# Patient Record
Sex: Male | Born: 1940 | Race: Asian | Hispanic: No | Marital: Married | Smoking: Former smoker
Health system: Southern US, Community
[De-identification: ages and names within clinical notes are randomized; demographics above are authoritative.]

## PROBLEM LIST (undated history)

## (undated) DIAGNOSIS — I249 Acute ischemic heart disease, unspecified: Secondary | ICD-10-CM

## (undated) DIAGNOSIS — I2510 Atherosclerotic heart disease of native coronary artery without angina pectoris: Secondary | ICD-10-CM

## (undated) HISTORY — DX: Atherosclerotic heart disease of native coronary artery without angina pectoris: I25.10

## (undated) HISTORY — DX: Acute ischemic heart disease, unspecified: I24.9

---

## 2015-09-02 HISTORY — PX: CORONARY ANGIOPLASTY WITH STENT PLACEMENT: SHX49

## 2021-08-15 ENCOUNTER — Encounter: Payer: Self-pay | Admitting: Nurse Practitioner

## 2021-08-15 ENCOUNTER — Ambulatory Visit (INDEPENDENT_AMBULATORY_CARE_PROVIDER_SITE_OTHER): Payer: Medicaid Other | Admitting: Nurse Practitioner

## 2021-08-15 ENCOUNTER — Other Ambulatory Visit: Payer: Self-pay

## 2021-08-15 VITALS — BP 133/69 | HR 69 | Temp 97.9°F | Ht 59.2 in | Wt 134.0 lb

## 2021-08-15 DIAGNOSIS — R300 Dysuria: Secondary | ICD-10-CM

## 2021-08-15 DIAGNOSIS — I2510 Atherosclerotic heart disease of native coronary artery without angina pectoris: Secondary | ICD-10-CM | POA: Diagnosis not present

## 2021-08-15 DIAGNOSIS — Z7689 Persons encountering health services in other specified circumstances: Secondary | ICD-10-CM | POA: Diagnosis not present

## 2021-08-15 DIAGNOSIS — R262 Difficulty in walking, not elsewhere classified: Secondary | ICD-10-CM

## 2021-08-15 DIAGNOSIS — Z125 Encounter for screening for malignant neoplasm of prostate: Secondary | ICD-10-CM

## 2021-08-15 DIAGNOSIS — Z1329 Encounter for screening for other suspected endocrine disorder: Secondary | ICD-10-CM

## 2021-08-15 DIAGNOSIS — Z789 Other specified health status: Secondary | ICD-10-CM

## 2021-08-15 MED ORDER — DILTIAZEM HCL ER BEADS 120 MG PO CP24: 120.0000 mg | ORAL_CAPSULE | Freq: Every day | ORAL | 1 refills | Status: DC

## 2021-08-15 MED ORDER — ROLLATOR ULTRA-LIGHT MISC: 1.0000 "application " | Freq: Every day | 0 refills | Status: AC

## 2021-08-15 MED ORDER — ROSUVASTATIN CALCIUM 20 MG PO TABS: 20.0000 mg | ORAL_TABLET | Freq: Every day | ORAL | 1 refills | Status: DC

## 2021-08-15 MED ORDER — CLOPIDOGREL BISULFATE 75 MG PO TABS: 75.0000 mg | ORAL_TABLET | Freq: Every day | ORAL | 1 refills | Status: DC

## 2021-08-15 NOTE — Patient Instructions (Signed)
Health Maintenance, Male °Adopting a healthy lifestyle and getting preventive care are important in promoting health and wellness. Ask your health care provider about: °The right schedule for you to have regular tests and exams. °Things you can do on your own to prevent diseases and keep yourself healthy. °What should I know about diet, weight, and exercise? °Eat a healthy diet ° °Eat a diet that includes plenty of vegetables, fruits, low-fat dairy products, and lean protein. °Do not eat a lot of foods that are high in solid fats, added sugars, or sodium. °Maintain a healthy weight °Body mass index (BMI) is a measurement that can be used to identify possible weight problems. It estimates body fat based on height and weight. Your health care provider can help determine your BMI and help you achieve or maintain a healthy weight. °Get regular exercise °Get regular exercise. This is one of the most important things you can do for your health. Most adults should: °Exercise for at least 150 minutes each week. The exercise should increase your heart rate and make you sweat (moderate-intensity exercise). °Do strengthening exercises at least twice a week. This is in addition to the moderate-intensity exercise. °Spend less time sitting. Even light physical activity can be beneficial. °Watch cholesterol and blood lipids °Have your blood tested for lipids and cholesterol at 81 years of age, then have this test every 5 years. °You may need to have your cholesterol levels checked more often if: °Your lipid or cholesterol levels are high. °You are older than 81 years of age. °You are at high risk for heart disease. °What should I know about cancer screening? °Many types of cancers can be detected early and may often be prevented. Depending on your health history and family history, you may need to have cancer screening at various ages. This may include screening for: °Colorectal cancer. °Prostate cancer. °Skin cancer. °Lung  cancer. °What should I know about heart disease, diabetes, and high blood pressure? °Blood pressure and heart disease °High blood pressure causes heart disease and increases the risk of stroke. This is more likely to develop in people who have high blood pressure readings or are overweight. °Talk with your health care provider about your target blood pressure readings. °Have your blood pressure checked: °Every 3-5 years if you are 18-39 years of age. °Every year if you are 40 years old or older. °If you are between the ages of 65 and 75 and are a current or former smoker, ask your health care provider if you should have a one-time screening for abdominal aortic aneurysm (AAA). °Diabetes °Have regular diabetes screenings. This checks your fasting blood sugar level. Have the screening done: °Once every three years after age 45 if you are at a normal weight and have a low risk for diabetes. °More often and at a younger age if you are overweight or have a high risk for diabetes. °What should I know about preventing infection? °Hepatitis B °If you have a higher risk for hepatitis B, you should be screened for this virus. Talk with your health care provider to find out if you are at risk for hepatitis B infection. °Hepatitis C °Blood testing is recommended for: °Everyone born from 1945 through 1965. °Anyone with known risk factors for hepatitis C. °Sexually transmitted infections (STIs) °You should be screened each year for STIs, including gonorrhea and chlamydia, if: °You are sexually active and are younger than 81 years of age. °You are older than 81 years of age and your   health care provider tells you that you are at risk for this type of infection. Your sexual activity has changed since you were last screened, and you are at increased risk for chlamydia or gonorrhea. Ask your health care provider if you are at risk. Ask your health care provider about whether you are at high risk for HIV. Your health care provider  may recommend a prescription medicine to help prevent HIV infection. If you choose to take medicine to prevent HIV, you should first get tested for HIV. You should then be tested every 3 months for as long as you are taking the medicine. Follow these instructions at home: Alcohol use Do not drink alcohol if your health care provider tells you not to drink. If you drink alcohol: Limit how much you have to 0-2 drinks a day. Know how much alcohol is in your drink. In the [place] one drink equals one 12 oz bottle of beer (355 mL), one 5 oz glass of wine (148 mL), or one 1 oz glass of hard liquor (44 mL). Lifestyle Do not use any products that contain nicotine or tobacco. These products include cigarettes, chewing tobacco, and vaping devices, such as e-cigarettes. If you need help quitting, ask your health care provider. Do not use street drugs. Do not share needles. Ask your health care provider for help if you need support or information about quitting drugs. General instructions Schedule regular health, dental, and eye exams. Stay current with your vaccines. Tell your health care provider if: You often feel depressed. You have ever been abused or do not feel safe at home. Summary Adopting a healthy lifestyle and getting preventive care are important in promoting health and wellness. Follow your health care provider's instructions about healthy diet, exercising, and getting tested or screened for diseases. Follow your health care provider's instructions on monitoring your cholesterol and blood pressure. This information is not intended to replace advice given to you by your health care provider. Make sure you discuss any questions you have with your health care provider. Document Revised: 01/07/2021 Document Reviewed: 01/07/2021 Elsevier Patient Education  2022 [employer]  Coronary Artery Disease, Male Coronary artery disease (CAD) is a condition in which the arteries that lead to the  heart (coronary arteries) become narrow or blocked. The narrowing or blockage can lead to decreased blood flow to the heart. Prolonged reduced blood flow can cause a heart attack (myocardial infarction or MI). This condition may also be called coronary heart disease. Because CAD is the leading cause of death in men, it is important to understand what causes this condition and how it is treated. What are the causes? CAD is most often caused by atherosclerosis. This is the buildup of fat and cholesterol (plaque) on the inside of the arteries. Over time, the plaque may narrow or block the artery, reducing blood flow to the heart. Plaque can also become weak and break off within a coronary artery and cause a sudden blockage. Other less common causes of CAD include: A blood clot or a piece of a blood clot or other substance that blocks the flow of blood in a coronary artery (embolism). A tearing of the artery (spontaneous coronary artery dissection). An enlargement of an artery (aneurysm). Inflammation (vasculitis) in the artery wall. What increases the risk? The following factors may make you more likely to develop this condition: Age. Men over age 81 are at a greater risk of CAD. Family history of CAD. Gender. Men often develop CAD earlier  in life than women. High blood pressure (hypertension). Diabetes. High cholesterol levels. Tobacco use. Excessive alcohol use. Lack of exercise. A diet high in saturated and trans fats, such as fried food and processed meat. Other possible risk factors include: High stress levels. Depression. Obesity. Sleep apnea. What are the signs or symptoms? Many people do not have any symptoms during the early stages of CAD. As the condition progresses, symptoms may include: Chest pain (angina). The pain can: Feel like crushing or squeezing, or like a tightness, pressure, fullness, or heaviness in the chest. Last more than a few minutes or can stop and recur. The  pain tends to get worse with exercise or stress and to fade with rest. Pain in the arms, neck, jaw, ear, or back. Unexplained heartburn or indigestion. Shortness of breath. Nausea or vomiting. Sudden light-headedness. Sudden cold sweats. Fluttering or fast heartbeat (palpitations). How is this diagnosed? This condition is diagnosed based on: Your family and medical history. A physical exam. Tests, including: A test to check the electrical signals in your heart (electrocardiogram). Exercise stress test. This looks for signs of blockage when the heart is stressed with exercise, such as running on a treadmill. Pharmacologic stress test. This test looks for signs of blockage when the heart is being stressed with a medicine. Blood tests. Coronary angiogram. This is a procedure to look at the coronary arteries to see if there is any blockage. During this test, a dye is injected into your arteries so they appear on an X-ray. Coronary artery CT scan. This CT scan helps detect calcium deposits in your coronary arteries. Calcium deposits are an indicator of CAD. A test that uses sound waves to take a picture of your heart (echocardiogram). Chest X-ray. How is this treated? This condition may be treated by: Healthy lifestyle changes to reduce risk factors. Medicines such as: Antiplatelet medicines and blood-thinning medicines, such as aspirin. These help to prevent blood clots. Nitroglycerin. Blood pressure medicines. Cholesterol-lowering medicine. Coronary angioplasty and stenting. During this procedure, a thin, flexible tube is inserted through a blood vessel and into a blocked artery. A balloon or similar device on the end of the tube is inflated to open up the artery. In some cases, a small, mesh tube (stent) is inserted into the artery to keep it open. Coronary artery bypass surgery. During this surgery, veins or arteries from other parts of the body are used to create a bypass around the  blockage and allow blood to reach your heart. Follow these instructions at home: Medicines Take over-the-counter and prescription medicines only as told by your health care provider. Do not take the following medicines unless your health care provider approves: NSAIDs, such as ibuprofen, naproxen, or celecoxib. Vitamin supplements that contain vitamin A, vitamin E, or both. Lifestyle Follow an exercise program approved by your health care provider. Aim for 150 minutes of moderate exercise or 75 minutes of vigorous exercise each week. Maintain a healthy weight or lose weight as approved by your health care provider. Learn to manage stress or try to limit your stress. Ask your health care provider for suggestions if you need help. Get screened for depression and seek treatment, if needed. Do not use any products that contain nicotine or tobacco, such as cigarettes, e-cigarettes, and chewing tobacco. If you need help quitting, ask your health care provider. Do not use illegal drugs. Eating and drinking  Follow a heart-healthy diet. A dietitian can help educate you about healthy food options and changes. In general,  eat plenty of fruits and vegetables, lean meats, and whole grains. Avoid foods high in: Sugar. Salt (sodium). Saturated fat, such as processed or fatty meat. Trans fat, such as fried foods. Use healthy cooking methods such as roasting, grilling, broiling, baking, poaching, steaming, or stir-frying. Do not drink alcohol if your health care provider tells you not to drink. If you drink alcohol: Limit how much you have to 0-2 drinks per day. Be aware of how much alcohol is in your drink. In the [place] one drink equals one 12 oz bottle of beer (355 mL), one 5 oz glass of wine (148 mL), or one 1 oz glass of hard liquor (44 mL). General instructions Manage any other health conditions, such as hypertension and diabetes. These conditions affect your heart. Your health care provider may  ask you to monitor your blood pressure. Ideally, your blood pressure should be below 130/80. Keep all follow-up visits as told by your health care provider. This is important. Get help right away if: You have pain in your chest, neck, ear, arm, jaw, stomach, or back that: Lasts more than a few minutes. Is recurring. Is not relieved by taking medicine under your tongue (sublingual nitroglycerin). You have profuse sweating without cause. You have unexplained: Heartburn or indigestion. Shortness of breath or difficulty breathing. Fluttering or fast heartbeat (palpitations). Nausea or vomiting. Fatigue. Feelings of nervousness or anxiety. Weakness. Diarrhea. You have sudden light-headedness or dizziness. You faint. You feel like hurting yourself or think about taking your own life. These symptoms may represent a serious problem that is an emergency. Do not wait to see if the symptoms will go away. Get medical help right away. Call your local emergency services (911 in the [place] Do not drive yourself to the hospital. Summary Coronary artery disease (CAD) is a condition in which the arteries that lead to the heart (coronary arteries) become narrow or blocked. The narrowing or blockage can lead to a heart attack. Many people do not have any symptoms during the early stages of CAD. CAD can be treated with lifestyle changes, medicines, surgery, or a combination of these treatments. This information is not intended to replace advice given to you by your health care provider. Make sure you discuss any questions you have with your health care provider. Document Revised: 05/07/2018 Document Reviewed: 04/27/2018 Elsevier Patient Education  2022 [employer].

## 2021-08-15 NOTE — Progress Notes (Signed)
[facility] Patient [facility] [address] [place], [place]  [address] Phone:  [phone]   Fax:  [phone]   New Patient Office Visit  Subjective:  Patient ID: Christopher Chandler, male    DOB: August 07, 1941  Age: 80 y.o. MRN: [id number]  CC:  Chief Complaint  Patient presents with   Establish Care    Recently arrived [name] 1 week ago, per medical from patient is needing Cardiology consultation.  Complaining of burning while urinating.     HPI Christopher Chandler presents for establish care.  He  has a past medical history of ACS (acute coronary syndrome) (HCC) and CAD (coronary artery disease).   He recently moved from [name]. He has CAD. He has a stent placed. He is curerntly on Plavix, diltiazem and  rosuvastatin . He denies any chest pain. He has some shortness of breath with walking. Denies headache, dizziness, visual changes, chest pain, nausea, vomiting or any edema. He has come concerns about his prostate. He suffers from dysuria.   He is SP lumbar spinal fusion. He is no longer a candidate for surgery.   He suffers from knee pain. He has problems walking due to the pain in lower back. He is unable to walk for long periods.   Past Medical History:  Diagnosis Date   ACS (acute coronary syndrome) (HCC)    CAD (coronary artery disease)     Past Surgical History:  Procedure Laterality Date   CORONARY ANGIOPLASTY WITH STENT PLACEMENT  2017    History reviewed. No pertinent family history.  Social History   Socioeconomic History   Marital status: Married    Spouse name: Not on file   Number of children: Not on file   Years of education: Not on file   Highest education level: Not on file  Occupational History   Not on file  Tobacco Use   Smoking status: Former    Types: Cigarettes   Smokeless tobacco: Former  Substance and Sexual Activity   Alcohol use: Never   Drug use: Never   Sexual activity: Not on file  Other Topics Concern   Not on file  Social  History Narrative   Not on file   Social Determinants of Health   Financial Resource Strain: Not on file  Food Insecurity: Not on file  Transportation Needs: Not on file  Physical Activity: Not on file  Stress: Not on file  Social Connections: Not on file  Intimate Partner Violence: Not on file    Outpatient Medications Prior to Visit  Medication Sig Dispense Refill   b complex vitamins capsule Take 1 capsule by mouth daily.     clopidogrel (PLAVIX) 75 MG tablet Take 75 mg by mouth daily. Take 1 tablet daily in the evening.     diltiazem (TIAZAC) 120 MG 24 hr capsule Take 120 mg by mouth daily. Take 1 daily in the morning.     rosuvastatin (CRESTOR) 20 MG tablet Take 20 mg by mouth daily. Take one tablet daily in the evening.     No facility-administered medications prior to visit.    No Known Allergies  ROS Review of Systems  HENT:         Hearing aide both ears     Objective:    Physical Exam Constitutional:      General: He is not in acute distress.    Appearance: He is normal weight. He is not ill-appearing, toxic-appearing or diaphoretic.  HENT:  Head: Normocephalic and atraumatic.     Ears:     Comments: Bilateral hearing aids    Nose: Nose normal.     Mouth/Throat:     Mouth: Mucous membranes are moist.  Cardiovascular:     Rate and Rhythm: Normal rate and regular rhythm.     Pulses: Normal pulses.     Heart sounds: Normal heart sounds.  Pulmonary:     Effort: Pulmonary effort is normal.     Breath sounds: Normal breath sounds.  Abdominal:     Palpations: Abdomen is soft.     Comments: Hypoactive  Musculoskeletal:     Cervical back: Normal range of motion. No rigidity.     Right lower leg: Edema (trace) present.     Left lower leg: Edema (trace) present.  Skin:    General: Skin is warm and dry.     Capillary Refill: Capillary refill takes less than 2 seconds.  Neurological:     General: No focal deficit present.     Mental Status: He is alert  and oriented to person, place, and time.  Psychiatric:        Mood and Affect: Mood normal.        Behavior: Behavior normal.        Thought Content: Thought content normal.        Judgment: Judgment normal.   BP 133/69    Pulse 69    Temp 97.9 F (36.6 C)    Ht 4' 11.2" (1.504 m)    Wt 134 lb 0.2 oz (60.8 kg)    SpO2 98%    BMI 26.88 kg/m  Wt Readings from Last 3 Encounters:  08/15/21 134 lb 0.2 oz (60.8 kg)     There are no preventive care reminders to display for this patient.   There are no preventive care reminders to display for this patient.    Assessment & Plan:   Problem List Items Addressed This Visit   None Visit Diagnoses     Encounter to establish care    -  Primary .Discussed male health maintenance;   and annual exams. Discussed prostate age related concerns Discussed general safety in vehicle and COVID Discussed regular hydration with water Discussed healthy diet and exercise and weight management Discussed mental health Encouraged to call our office for an appointment with in ongoing concerns for questions.       Language barrier to communication       Coronary artery disease involving native heart without angina pectoris, unspecified vessel or lesion type     Audiology referral for evaluation   Relevant Medications   diltiazem (TIAZAC) 120 MG 24 hr capsule   rosuvastatin (CRESTOR) 20 MG tablet   Other Relevant Orders   Comp. Metabolic Panel (12) (Completed)   Lipid panel (Completed)   Microalbumin, urine   Dysuria       Relevant Orders   CBC with Differential/Platelet (Completed)   Urine Culture   Microalbumin, urine   Screening PSA (prostate specific antigen)       Relevant Orders   PSA (Completed)   Screening for thyroid disorder       Relevant Orders   TSH (Completed)   Ambulatory dysfunction       Relevant Orders   For home use only DME 4 wheeled rolling walker with seat ([id number])       Meds ordered this encounter  Medications    Misc. Devices (ROLLATOR ULTRA-LIGHT) MISC    Sig: 1  application by Does not apply route daily.    Dispense:  1 each    Refill:  0    Order Specific Question:   Supervising Provider    Answer:   Quentin Angst [[id number]]   diltiazem (TIAZAC) 120 MG 24 hr capsule    Sig: Take 1 capsule (120 mg total) by mouth daily. Take 1 daily in the morning.    Dispense:  90 capsule    Refill:  1    Order Specific Question:   Supervising Provider    Answer:   Quentin Angst [id number]   clopidogrel (PLAVIX) 75 MG tablet    Sig: Take 1 tablet (75 mg total) by mouth daily. Take 1 tablet daily in the evening.    Dispense:  90 tablet    Refill:  1    Order Specific Question:   Supervising Provider    Answer:   Quentin Angst [[id number]]   rosuvastatin (CRESTOR) 20 MG tablet    Sig: Take 1 tablet (20 mg total) by mouth daily. Take one tablet daily in the evening.    Dispense:  90 tablet    Refill:  1    Order Specific Question:   Supervising Provider    Answer:   Quentin Angst [id number]    Follow-up: No follow-ups on file.    Barbette Merino, NP

## 2021-08-16 ENCOUNTER — Encounter: Payer: Self-pay | Admitting: Nurse Practitioner

## 2021-08-16 LAB — LIPID PANEL
Chol/HDL Ratio: 2.8 ratio (ref 0.0–5.0)
Cholesterol, Total: 115 mg/dL (ref 100–199)
HDL: 41 mg/dL (ref >39)
LDL Chol Calc (NIH): 57 mg/dL (ref 0–99)
Triglycerides: 86 mg/dL (ref 0–149)
VLDL Cholesterol Cal: 17 mg/dL (ref 5–40)

## 2021-08-16 LAB — COMP. METABOLIC PANEL (12)
AST: 35 IU/L (ref 0–40)
Albumin/Globulin Ratio: 1.5 (ref 1.2–2.2)
Albumin: 4 g/dL (ref 3.6–4.6)
Alkaline Phosphatase: 98 IU/L (ref 44–121)
BUN/Creatinine Ratio: 18 (ref 10–24)
BUN: 17 mg/dL (ref 8–27)
Bilirubin Total: 0.4 mg/dL (ref 0.0–1.2)
Calcium: 9.3 mg/dL (ref 8.6–10.2)
Chloride: 105 mmol/L (ref 96–106)
Creatinine, Ser: 0.94 mg/dL (ref 0.76–1.27)
Globulin, Total: 2.6 g/dL (ref 1.5–4.5)
Glucose: 98 mg/dL (ref 70–99)
Potassium: 4.2 mmol/L (ref 3.5–5.2)
Sodium: 142 mmol/L (ref 134–144)
Total Protein: 6.6 g/dL (ref 6.0–8.5)
eGFR: 81 mL/min/{1.73_m2} (ref >59)

## 2021-08-16 LAB — CBC WITH DIFFERENTIAL/PLATELET
Basophils Absolute: 0 10*3/uL (ref 0.0–0.2)
Basos: 1 %
EOS (ABSOLUTE): 0.2 10*3/uL (ref 0.0–0.4)
Eos: 3 %
Hematocrit: 39.9 % (ref 37.5–51.0)
Hemoglobin: 13 g/dL (ref 13.0–17.7)
Immature Grans (Abs): 0 10*3/uL (ref 0.0–0.1)
Immature Granulocytes: 0 %
Lymphocytes Absolute: 2.3 10*3/uL (ref 0.7–3.1)
Lymphs: 37 %
MCH: 31.4 pg (ref 26.6–33.0)
MCHC: 32.6 g/dL (ref 31.5–35.7)
MCV: 96 fL (ref 79–97)
Monocytes Absolute: 0.5 10*3/uL (ref 0.1–0.9)
Monocytes: 9 %
Neutrophils Absolute: 3.2 10*3/uL (ref 1.4–7.0)
Neutrophils: 50 %
Platelets: 184 10*3/uL (ref 150–450)
RBC: 4.14 x10E6/uL (ref 4.14–5.80)
RDW: 12.5 % (ref 11.6–15.4)
WBC: 6.3 10*3/uL (ref 3.4–10.8)

## 2021-08-16 LAB — PSA: Prostate Specific Ag, Serum: 1.2 ng/mL (ref 0.0–4.0)

## 2021-08-16 LAB — TSH: TSH: 1.81 u[IU]/mL (ref 0.450–4.500)

## 2021-08-21 ENCOUNTER — Other Ambulatory Visit: Payer: Self-pay

## 2021-08-21 MED ORDER — ROSUVASTATIN CALCIUM 20 MG PO TABS
20.0000 mg | ORAL_TABLET | Freq: Every day | ORAL | 1 refills | Status: DC
  Filled 2021-08-21: qty 90, 90d supply, fill #0

## 2021-08-21 NOTE — Telephone Encounter (Signed)
Patient case worker stated Rosuvastatin was $200 at [facility]. Rx sent to [facility]. Left voicemail with the case worker Alsana advising of the change.

## 2021-08-22 ENCOUNTER — Other Ambulatory Visit: Payer: Self-pay | Admitting: Nurse Practitioner

## 2021-08-22 LAB — URINE CULTURE

## 2021-08-22 LAB — MICROALBUMIN, URINE: Microalbumin, Urine: 17.3 ug/mL

## 2021-08-22 MED ORDER — NITROFURANTOIN MONOHYD MACRO 100 MG PO CAPS: 100.0000 mg | ORAL_CAPSULE | Freq: Two times a day (BID) | ORAL | 0 refills | Status: AC

## 2021-08-28 ENCOUNTER — Other Ambulatory Visit: Payer: Self-pay

## 2021-09-24 ENCOUNTER — Ambulatory Visit
Admission: RE | Admit: 2021-09-24 | Discharge: 2021-09-24 | Disposition: A | Discharge status: Home / Self Care | Payer: Self-pay | Source: Ambulatory Visit | Attending: Obstetrics and Gynecology | Admitting: Obstetrics and Gynecology

## 2021-09-24 ENCOUNTER — Other Ambulatory Visit: Payer: Self-pay

## 2021-09-24 ENCOUNTER — Other Ambulatory Visit: Payer: Self-pay | Admitting: Obstetrics and Gynecology

## 2021-09-24 DIAGNOSIS — R9389 Abnormal findings on diagnostic imaging of other specified body structures: Secondary | ICD-10-CM

## 2021-09-27 ENCOUNTER — Telehealth: Payer: Self-pay | Admitting: Nurse Practitioner

## 2021-09-27 NOTE — Telephone Encounter (Signed)
Patient's case manager is requesting update on the referral to cardiology. She also states patient is running low on medication (I have requested the name of the med from the case manager).  I do not see a referral, but could be missing something.  Case Manager: Herschell Dimes  [phone]

## 2021-10-07 ENCOUNTER — Telehealth: Payer: Self-pay | Admitting: Nurse Practitioner

## 2021-10-07 ENCOUNTER — Other Ambulatory Visit (HOSPITAL_COMMUNITY): Payer: Self-pay

## 2021-10-07 ENCOUNTER — Other Ambulatory Visit: Payer: Self-pay | Admitting: Nurse Practitioner

## 2021-10-07 MED ORDER — ROSUVASTATIN CALCIUM 20 MG PO TABS
20.0000 mg | ORAL_TABLET | Freq: Every day | ORAL | 1 refills | Status: AC
  Filled 2021-10-07: qty 90, 90d supply, fill #0

## 2021-10-07 MED ORDER — DILTIAZEM HCL ER BEADS 120 MG PO CP24
120.0000 mg | ORAL_CAPSULE | Freq: Every day | ORAL | 1 refills | Status: AC
Start: 2021-10-07 — End: 2022-04-05
  Filled 2021-10-07: qty 90, 90d supply, fill #0

## 2021-10-07 MED ORDER — CLOPIDOGREL BISULFATE 75 MG PO TABS
75.0000 mg | ORAL_TABLET | Freq: Every day | ORAL | 1 refills | Status: AC
Start: 2021-10-07 — End: 2022-04-05
  Filled 2021-10-07: qty 90, 90d supply, fill #0

## 2021-10-07 NOTE — Telephone Encounter (Signed)
Patient's case manager indicates patient is running low on meds:  Diltiazem 90mg  SR Diltiazem 30 MG Rosuvastatin 20mg  Glocovit dietary supplement   Case Manager: [phone]

## 2021-10-07 NOTE — Telephone Encounter (Signed)
Addendum - Send meds to [facility] and let case manager know when they are sent. Herschell Dimes  [phone]

## 2021-10-08 ENCOUNTER — Other Ambulatory Visit (HOSPITAL_COMMUNITY): Payer: Self-pay

## 2021-10-09 ENCOUNTER — Other Ambulatory Visit (HOSPITAL_COMMUNITY): Payer: Self-pay

## 2021-10-10 ENCOUNTER — Other Ambulatory Visit (HOSPITAL_COMMUNITY): Payer: Self-pay

## 2021-11-04 ENCOUNTER — Other Ambulatory Visit: Payer: Self-pay | Admitting: Obstetrics and Gynecology

## 2021-11-04 ENCOUNTER — Ambulatory Visit
Admission: RE | Admit: 2021-11-04 | Discharge: 2021-11-04 | Disposition: A | Discharge status: Home / Self Care | Payer: No Typology Code available for payment source | Source: Ambulatory Visit | Attending: Obstetrics and Gynecology | Admitting: Obstetrics and Gynecology

## 2021-11-04 DIAGNOSIS — R9389 Abnormal findings on diagnostic imaging of other specified body structures: Secondary | ICD-10-CM

## 2021-11-08 ENCOUNTER — Telehealth: Payer: Self-pay | Admitting: Nurse Practitioner

## 2021-11-08 NOTE — Telephone Encounter (Signed)
She has requested the pt have a CAT scan done of chest, but chest Xray was done instead. Request was faxed on 3/2. She left voicemail for nurse a couple days ago.  ?

## 2021-11-11 ENCOUNTER — Telehealth: Payer: Self-pay | Admitting: Nurse Practitioner

## 2021-11-11 NOTE — Telephone Encounter (Signed)
Patient's case worker - Herschell Dimes ?Phone [phone] asked about patient's vitamin: Becozym C Forte ( Vitamin B Complex + Biotin + Vitamin C vitamins) that patient is unable to find over the counter. Is there an RX for this supplement or can his provider direct him to a pharmacy that carries the supplement? ? ?

## 2021-11-12 NOTE — Telephone Encounter (Signed)
Advise patient he can purchase vitamin B complex over the counter at any pharmacy no prescription is needed  ?

## 2021-11-13 ENCOUNTER — Ambulatory Visit (HOSPITAL_COMMUNITY)
Admission: RE | Admit: 2021-11-13 | Discharge: 2021-11-13 | Disposition: A | Discharge status: Home / Self Care | Payer: Medicaid Other | Source: Ambulatory Visit | Attending: Nurse Practitioner | Admitting: Nurse Practitioner

## 2021-11-13 ENCOUNTER — Ambulatory Visit (INDEPENDENT_AMBULATORY_CARE_PROVIDER_SITE_OTHER): Payer: Medicaid Other | Admitting: Nurse Practitioner

## 2021-11-13 ENCOUNTER — Other Ambulatory Visit: Payer: Self-pay

## 2021-11-13 ENCOUNTER — Encounter: Payer: Self-pay | Admitting: Nurse Practitioner

## 2021-11-13 VITALS — BP 134/53 | HR 64 | Temp 97.6°F | Ht 59.2 in | Wt 130.6 lb

## 2021-11-13 DIAGNOSIS — G8929 Other chronic pain: Secondary | ICD-10-CM | POA: Diagnosis present

## 2021-11-13 DIAGNOSIS — Z789 Other specified health status: Secondary | ICD-10-CM | POA: Diagnosis not present

## 2021-11-13 DIAGNOSIS — M160 Bilateral primary osteoarthritis of hip: Secondary | ICD-10-CM

## 2021-11-13 DIAGNOSIS — M5441 Lumbago with sciatica, right side: Secondary | ICD-10-CM | POA: Diagnosis not present

## 2021-11-13 DIAGNOSIS — K5900 Constipation, unspecified: Secondary | ICD-10-CM | POA: Diagnosis not present

## 2021-11-13 DIAGNOSIS — R262 Difficulty in walking, not elsewhere classified: Secondary | ICD-10-CM

## 2021-11-13 MED ORDER — B COMPLEX VITAMINS PO CAPS: 1.0000 | ORAL_CAPSULE | Freq: Every day | ORAL | 3 refills | Status: AC

## 2021-11-13 MED ORDER — POLYETHYLENE GLYCOL 3350 17 GM/SCOOP PO POWD: 17.0000 g | ORAL | 0 refills | Status: AC

## 2021-11-13 MED ORDER — VITAMIN B-12 1000 MCG PO TABS: 1000.0000 ug | ORAL_TABLET | Freq: Every day | ORAL | 2 refills | Status: AC

## 2021-11-13 MED ORDER — ROLLATOR ULTRA-LIGHT MISC: 1.0000 | Freq: Every day | 0 refills | Status: AC

## 2021-11-13 NOTE — Patient Instructions (Addendum)
Christopher Chandler  ?First Floor Radiology Department  ? ?Imaging Scheduling if you get lost please call  ?[phone] ? ? ?Christopher Chandler ?This is your after visit summary for today.  ?1. Language barrier to communication ? ?2. Ambulatory dysfunction ?- DME Wheelchair electric ? ?3. Constipation, unspecified constipation type ? ?4. Chronic bilateral low back pain with right-sided sciatica ? ?Christopher Chandler ?- DG Lumbar Spine Complete; Future ?- DG Foot Complete Right; Future ? ?5. Primary osteoarthritis of both hips ?- DG HIPS BILAT WITH PELVIS 2V; Future ?  ?No follow-ups on file.  ?Please feel free to call our office, if you have any questions. ?Have a good day. ?Christopher Chandler ? ? ? ? ? ? ?Chronic Back Pain ?When back pain lasts longer than 3 months, it is called chronic back pain. Pain may get worse at certain times (flare-ups). There are things you can do at home to manage your pain. ?Follow these instructions at home: ?Pay attention to any changes in your symptoms. Take these actions to help with your pain: ?Managing pain and stiffness ?  ?If told, put ice on the painful area. Your doctor may tell you to use ice for 24-48 hours after the flare-up starts. To do this: ?Put ice in a plastic bag. ?Place a towel between your skin and the bag. ?Leave the ice on for 20 minutes, 2-3 times a day. ?If told, put heat on the painful area. Do this as often as told by your doctor. Use the heat source that your doctor recommends, such as a moist heat pack or a heating pad. ?Place a towel between your skin and the heat source. ?Leave the heat on for 20-30 minutes. ?Take off the heat if your skin turns bright red. This is especially important if you are unable to feel pain, heat, or cold. You may have a greater risk of getting burned. ?Soak in a warm bath. This can help relieve pain. ?Activity ? ?Avoid bending and other activities that make pain worse. ?When standing: ?Keep your upper back and neck straight. ?Keep your shoulders  pulled back. ?Avoid slouching. ?When sitting: ?Keep your back straight. ?Relax your shoulders. Do not round your shoulders or pull them backward. ?Do not sit or stand in one place for Chandler periods of time. ?Take short rest breaks during the day. Lying down or standing is usually better than sitting. Resting can help relieve pain. ?When sitting or lying down for a Chandler time, do some mild activity or stretching. This will help to prevent stiffness and pain. ?Get regular exercise. Ask your doctor what activities are safe for you. ?Do not lift anything that is heavier than 10 lb (4.5 kg) or the limit that you are told, until your doctor says that it is safe. ?To prevent injury when you lift things: ?Bend your knees. ?Keep the weight close to your body. ?Avoid twisting. ?Sleep on a firm mattress. Try lying on your side with your knees slightly bent. If you lie on your back, put a pillow under your knees. ?Medicines ?Treatment may include medicines for pain and swelling taken by mouth or put on the skin, prescription pain medicine, or muscle relaxants. ?Take over-the-counter and prescription medicines only as told by your doctor. ?Ask your doctor if the medicine prescribed to you: ?Requires you to avoid driving or using machinery. ?Can cause trouble pooping (constipation). You may need to take these actions to prevent or treat trouble pooping: ?Drink enough fluid to keep your pee (urine)  pale yellow. ?Take over-the-counter or prescription medicines. ?Eat foods that are high in fiber. These include beans, whole grains, and fresh fruits and vegetables. ?Limit foods that are high in fat and sugars. These include fried or sweet foods. ?General instructions ?Do not use any products that contain nicotine or tobacco, such as cigarettes, e-cigarettes, and chewing tobacco. If you need help quitting, ask your doctor. ?Keep all follow-up visits as told by your doctor. This is important. ?Contact a doctor if: ?Your pain does not get  better with rest or medicine. ?Your pain gets worse, or you have new pain. ?You have a high fever. ?You lose weight very quickly. ?You have trouble doing your normal activities. ?Get help right away if: ?One or both of your legs or feet feel weak. ?One or both of your legs or feet lose feeling (have numbness). ?You have trouble controlling when you poop (have a bowel movement) or pee (urinate). ?You have bad back pain and: ?You feel like you may vomit (nauseous), or you vomit. ?You have pain in your belly (abdomen). ?You have shortness of breath. ?You faint. ?Summary ?When back pain lasts longer than 3 months, it is called chronic back pain. ?Pain may get worse at certain times (flare-ups). ?Use ice and heat as told by your doctor. Your doctor may tell you to use ice after flare-ups. ?This information is not intended to replace advice given to you by your health care provider. Make sure you discuss any questions you have with your health care provider. ?Constipation, Adult ?Constipation is when a person has trouble pooping (having a bowel movement). When you have this condition, you may poop fewer than 3 times a week. Your poop (stool) may also be dry, hard, or bigger than normal. ?Follow these instructions at home: ?Eating and drinking ? ?Eat foods that have a lot of fiber, such as: ?Fresh fruits and vegetables. ?Whole grains. ?Beans. ?Eat less of foods that are low in fiber and high in fat and sugar, such as: ?[place] fries. ?Hamburgers. ?Cookies. ?Candy. ?Soda. ?Drink enough fluid to keep your pee (urine) pale yellow. ?General instructions ?Exercise regularly or as told by your doctor. Try to do 150 minutes of exercise each week. ?Go to the restroom when you feel like you need to poop. Do not hold it in. ?Take over-the-counter and prescription medicines only as told by your doctor. These include any fiber supplements. ?When you poop: ?Do deep breathing while relaxing your lower belly (abdomen). ?Relax your pelvic  floor. The pelvic floor is a group of muscles that support the rectum, bladder, and intestines (as well as the uterus in women). ?Watch your condition for any changes. Tell your doctor if you notice any. ?Keep all follow-up visits as told by your doctor. This is important. ?Contact a doctor if: ?You have pain that gets worse. ?You have a fever. ?You have not pooped for 4 days. ?You vomit. ?You are not hungry. ?You lose weight. ?You are bleeding from the opening of the butt (anus). ?You have thin, pencil-like poop. ?Get help right away if: ?You have a fever, and your symptoms suddenly get worse. ?You leak poop or have blood in your poop. ?Your belly feels hard or bigger than normal (bloated). ?You have very bad belly pain. ?You feel dizzy or you faint. ?Summary ?Constipation is when a person poops fewer than 3 times a week, has trouble pooping, or has poop that is dry, hard, or bigger than normal. ?Eat foods that have a lot of  fiber. ?Drink enough fluid to keep your pee (urine) pale yellow. ?Take over-the-counter and prescription medicines only as told by your doctor. These include any fiber supplements. ?This information is not intended to replace advice given to you by your health care provider. Make sure you discuss any questions you have with your health care provider. ?Document Revised: 07/06/2019 Document Reviewed: 07/06/2019 ?Elsevier Patient Education ? 2022 Elsevier Inc. ? ? ?Document Revised: 09/28/2019 Document Reviewed: 09/28/2019 ?Elsevier Patient Education ? 2022 Elsevier Inc. ? ?

## 2021-11-13 NOTE — Progress Notes (Signed)
? ?[facility] ?[address] ?[place], [place]  [address] ?Phone:  [phone]   Fax:  [phone] ? ? ?Established Patient Office Visit ? ?Subjective:  ?Patient ID: Christopher Chandler, male    DOB: 10/10/1940  Age: 80 y.o. MRN: [id number] ? ?CC:  ?Chief Complaint  ?Patient presents with  ? Follow-up  ?  Pt is here for 3 month follow up. Pt is having problem with walking and shortness of breath. Pt is interested in a walker and electronic wheelchair. Pt son would like a Rx for vitamin B  ? ? ?HPI ?Christopher Chandler presents for follow up. He  has a past medical history of ACS (acute coronary syndrome) (HCC) and CAD (coronary artery disease).  ?He is in today for follow-up.  He continues to experience difficulty walking.  A rollator was prescribed at last visit however this has not been obtained.  He does currently have a wheelchair.  The rollator is needed during the day for ambulation when family is not available.  He is also experiencing low back pain.He denies any recent injury or falls. He is SP low back surgery 17 yrs ago. He is also having right foot pain which is increased with walking. ? ?He is wanting a Rx for Vitamin B.  ? ?Upper Respiratory Infection ?Patient complains of symptoms of a URI. Symptoms include productive cough with  clear colored sputum. Onset of symptoms was a few days ago, and has been unchanged since that time. Treatment to date: none. His son does not want him to have any additional medication for the cough. ? ? ?Past Medical History:  ?Diagnosis Date  ? ACS (acute coronary syndrome) (HCC)   ? CAD (coronary artery disease)   ? ? ?Past Surgical History:  ?Procedure Laterality Date  ? CORONARY ANGIOPLASTY WITH STENT PLACEMENT  2017  ? ? ?History reviewed. No pertinent family history. ? ?Social History  ? ?Socioeconomic History  ? Marital status: Married  ?  Spouse name: Not on file  ? Number of children: Not on file  ? Years of education: Not on file  ? Highest education level:  Not on file  ?Occupational History  ? Not on file  ?Tobacco Use  ? Smoking status: Former  ?  Types: Cigarettes  ? Smokeless tobacco: Former  ?Substance and Sexual Activity  ? Alcohol use: Never  ? Drug use: Never  ? Sexual activity: Not on file  ?Other Topics Concern  ? Not on file  ?Social History Narrative  ? Not on file  ? ?Social Determinants of Health  ? ?Financial Resource Strain: Not on file  ?Food Insecurity: Not on file  ?Transportation Needs: Not on file  ?Physical Activity: Not on file  ?Stress: Not on file  ?Social Connections: Not on file  ?Intimate Partner Violence: Not on file  ? ? ?Outpatient Medications Prior to Visit  ?Medication Sig Dispense Refill  ? b complex vitamins capsule Take 1 capsule by mouth daily.    ? clopidogrel (PLAVIX) 75 MG tablet Take 1 tablet by mouth in the evening. 90 tablet 1  ? diltiazem (TIAZAC) 120 MG 24 hr capsule Take 1 capsule by mouth in the morning. 90 capsule 1  ? rosuvastatin (CRESTOR) 20 MG tablet Take 1 tablet by mouth daily in the evening. 90 tablet 1  ? Misc. Devices (ROLLATOR ULTRA-LIGHT) MISC 1 application by Does not apply route daily. 1 each 0  ? ?No facility-administered medications prior to visit.  ? ? ?  Allergies  ?Allergen Reactions  ? Metronidazole   ? ? ?ROS ?Review of Systems  ?Gastrointestinal:  Positive for constipation.  ? ?  ?Objective:  ?  ?Physical Exam ?HENT:  ?   Head: Normocephalic and atraumatic.  ?   Nose: Nose normal.  ?   Mouth/Throat:  ?   Mouth: Mucous membranes are moist.  ?Cardiovascular:  ?   Rate and Rhythm: Normal rate and regular rhythm.  ?   Pulses: Normal pulses.  ?   Heart sounds: Normal heart sounds.  ?Pulmonary:  ?   Effort: Pulmonary effort is normal.  ?   Breath sounds: Normal breath sounds.  ?Abdominal:  ?   General: Bowel sounds are normal.  ?   Palpations: Abdomen is soft.  ?Musculoskeletal:     ?   General: Normal range of motion.  ?   Cervical back: Normal range of motion.  ?Skin: ?   General: Skin is warm and dry.  ?    Capillary Refill: Capillary refill takes less than 2 seconds.  ?Neurological:  ?   General: No focal deficit present.  ?   Mental Status: He is alert and oriented to person, place, and time.  ?Psychiatric:     ?   Mood and Affect: Mood normal.     ?   Behavior: Behavior normal.     ?   Thought Content: Thought content normal.     ?   Judgment: Judgment normal.  ? ? ?BP (!) 134/53   Pulse 64   Temp 97.6 ?F (36.4 ?C)   Ht 4' 11.2" (1.504 m)   Wt 130 lb 9.6 oz (59.2 kg)   SpO2 100%   BMI 26.20 kg/m?  ?Wt Readings from Last 3 Encounters:  ?11/13/21 130 lb 9.6 oz (59.2 kg)  ?08/15/21 134 lb 0.2 oz (60.8 kg)  ? ? ? ?Health Maintenance Due  ?Topic Date Due  ? COVID-19 Vaccine (1) Never done  ? Zoster Vaccines- Shingrix (1 of 2) Never done  ? ? ?There are no preventive care reminders to display for this patient. ? ?Lab Results  ?Component Value Date  ? TSH 1.810 08/15/2021  ? ?Lab Results  ?Component Value Date  ? WBC 6.3 08/15/2021  ? HGB 13.0 08/15/2021  ? HCT 39.9 08/15/2021  ? MCV 96 08/15/2021  ? PLT 184 08/15/2021  ? ?Lab Results  ?Component Value Date  ? NA 142 08/15/2021  ? K 4.2 08/15/2021  ? GLUCOSE 98 08/15/2021  ? BUN 17 08/15/2021  ? CREATININE 0.94 08/15/2021  ? BILITOT 0.4 08/15/2021  ? ALKPHOS 98 08/15/2021  ? AST 35 08/15/2021  ? PROT 6.6 08/15/2021  ? ALBUMIN 4.0 08/15/2021  ? CALCIUM 9.3 08/15/2021  ? EGFR 81 08/15/2021  ? ?Lab Results  ?Component Value Date  ? CHOL 115 08/15/2021  ? ?Lab Results  ?Component Value Date  ? HDL 41 08/15/2021  ? ?Lab Results  ?Component Value Date  ? [place] 57 08/15/2021  ? ?Lab Results  ?Component Value Date  ? TRIG 86 08/15/2021  ? ?Lab Results  ?Component Value Date  ? CHOLHDL 2.8 08/15/2021  ? ?No results found for: HGBA1C ? ?  ?Assessment & Plan:  ? ?Problem List Items Addressed This Visit   ?None ?Visit Diagnoses   ? ? Ambulatory dysfunction    -  Primary  ? Relevant Orders  ? Rollator  ? Language barrier to communication      ? Constipation, unspecified  constipation type     ?  He ?Encouraged stool softeners one to two times per day based on what was discussed ?Encouraged Miralax QOD to daily based on need and dicussion ?Encouraged hydration with water at least 8-10 8 ounce glasses per day ?Eat foods that have a lot of fiber, such as: ?Fresh fruits and vegetables. ?Whole grains. ?Beans. ?Eat less of foods that are high in fat, low in fiber, or overly processed ?Encouraged regular daily exercise starting with walking 20 minutes per day ?  ? Chronic bilateral low back pain with right-sided sciatica     ?Persistent ?Encouraged evaluation with images to evaluate possible causes pain to discuss treatment options  ? Relevant Orders  ? DG Lumbar Spine Complete  ? DG Foot Complete Right  ? Primary osteoarthritis of both hips      ? Relevant Orders  ? DG HIPS BILAT WITH PELVIS 2V  ? ?  ? ? ?Meds ordered this encounter  ?Medications  ? vitamin B-12 (CYANOCOBALAMIN) 1000 MCG tablet  ?  Sig: Take 1 tablet (1,000 mcg total) by mouth daily.  ?  Dispense:  30 tablet  ?  Refill:  2  ?  Order Specific Question:   Supervising Provider  ?  AnswerTresa Garter [[id number]]  ? polyethylene glycol powder (GLYCOLAX/MIRALAX) 17 GM/SCOOP powder  ?  Sig: Take 17 g by mouth every other day. Follow instructions in the bottle. Use no more than 7 days. PRN for occasional constipation.  ?  Dispense:  500 g  ?  Refill:  0  ?  This is an OTC product. This prescription is to serve as a reminder to the patient as to our preference.  ?  Order Specific Question:   Supervising Provider  ?  AnswerTresa Garter [[id number]]  ? b complex vitamins capsule  ?  Sig: Take 1 capsule by mouth daily.  ?  Dispense:  90 capsule  ?  Refill:  3  ?  Order Specific Question:   Supervising Provider  ?  AnswerTresa Garter [[id number]]  ? Misc. Devices (ROLLATOR ULTRA-LIGHT) MISC  ?  Sig: 1 Device by Does not apply route daily.  ?  Dispense:  1 each  ?  Refill:  0  ?  Order Specific Question:    Supervising Provider  ?  AnswerTresa Garter [[id number]]  ? ? ?Follow-up: No follow-ups on file.  ? ? ?Vevelyn Francois, NP ?

## 2021-11-18 ENCOUNTER — Other Ambulatory Visit: Payer: Self-pay | Admitting: Nurse Practitioner

## 2021-11-18 ENCOUNTER — Telehealth: Payer: Self-pay

## 2021-11-18 DIAGNOSIS — M4186 Other forms of scoliosis, lumbar region: Secondary | ICD-10-CM

## 2021-11-18 DIAGNOSIS — R7611 Nonspecific reaction to tuberculin skin test without active tuberculosis: Secondary | ICD-10-CM

## 2021-11-18 DIAGNOSIS — M439 Deforming dorsopathy, unspecified: Secondary | ICD-10-CM

## 2021-11-18 NOTE — Telephone Encounter (Signed)
Is the CT chest with or without contrast ?

## 2021-11-18 NOTE — Progress Notes (Signed)
   [facility] [address] [place], [place]  [address] Phone:  [phone]   Fax:  [phone] 

## 2021-11-19 NOTE — Telephone Encounter (Signed)
Noted, thanks!

## 2021-11-20 NOTE — Addendum Note (Signed)
Addended by: Lindley Magnus L on: 11/20/2021 12:07 AM ? ? Modules accepted: Orders ? ?

## 2021-11-26 ENCOUNTER — Telehealth: Payer: Self-pay | Admitting: Nurse Practitioner

## 2021-11-26 NOTE — Telephone Encounter (Signed)
Patient's case manager Herschell Dimes [phone] requested refills on the following on behalf of the patient: ?Crestor 20mg  ?Diltiazem 120mg  ?Plavix 75mg  ? ?Please send to the pharmacy on file. ? ?Thank you. ?

## 2021-12-10 ENCOUNTER — Ambulatory Visit (INDEPENDENT_AMBULATORY_CARE_PROVIDER_SITE_OTHER): Payer: Medicaid Other

## 2021-12-10 ENCOUNTER — Encounter: Payer: Self-pay | Admitting: Orthopaedic Surgery

## 2021-12-10 ENCOUNTER — Ambulatory Visit (INDEPENDENT_AMBULATORY_CARE_PROVIDER_SITE_OTHER): Payer: Medicaid Other | Admitting: Orthopaedic Surgery

## 2021-12-10 ENCOUNTER — Other Ambulatory Visit: Payer: Medicaid Other

## 2021-12-10 ENCOUNTER — Other Ambulatory Visit: Payer: Self-pay

## 2021-12-10 VITALS — BP 119/70 | HR 65 | Ht 59.2 in | Wt 130.0 lb

## 2021-12-10 DIAGNOSIS — M545 Low back pain, unspecified: Secondary | ICD-10-CM

## 2021-12-10 DIAGNOSIS — R7611 Nonspecific reaction to tuberculin skin test without active tuberculosis: Secondary | ICD-10-CM

## 2021-12-10 DIAGNOSIS — S22000S Wedge compression fracture of unspecified thoracic vertebra, sequela: Secondary | ICD-10-CM | POA: Diagnosis not present

## 2021-12-10 DIAGNOSIS — G8929 Other chronic pain: Secondary | ICD-10-CM

## 2021-12-10 NOTE — Progress Notes (Signed)
? ?Office Visit Note ?  ?Patient: Christopher Chandler           ?Date of Birth: [birth year]           ?MRN: [id number] ?Visit Date: 12/10/2021 ?             ?Requested by: Vevelyn Francois, NP ?[address] ?[place],  [place] [id number] ?PCP: Vevelyn Francois, NP ? ? ?Assessment & Plan: ?Visit Diagnoses:  ?1. Chronic bilateral low back pain, unspecified whether sciatica present   ?2. Thoracic compression fracture, sequela   ? ? ?Plan: Patient needs a folding walker 5 inch front wheels can use for ambulation in the community.  Rolling wheelchair standard that his wife could use for more extended distances.  Plain Tylenol recommended for pain .  ? ?Follow-Up Instructions: No follow-ups on file.  ? ?Orders:  ?Orders Placed This Encounter  ?Procedures  ? XR Lumbar Spine 2-3 Views  ? ?No orders of the defined types were placed in this encounter. ? ? ? ? Procedures: ?No procedures performed ? ? ?Clinical Data: ?No additional findings. ? ? ?Subjective: ?Chief Complaint  ?Patient presents with  ? Lower Back - Pain  ? ? ?HPI 81 year old male from [place] who had previous surgery 18 years ago in [place] L5-S1 is seen with back pain difficulty walking and difficulty standing up straight.  He does exercises each morning.  He has heart disease severe calcification of the abdominal aorta.  Previous x-rays 11/13/2021 showed compression fractures T11 and T12.  He has difficulty when he goes out in the community since he is having trouble being upright and walking.  Patient is here with his wife and also [place] interpreter.  With his quads.  Chronic back pain x2 years no falls in that time period. ? ? ?Review of Systems Positive for heart disease ? ? ?Objective: ?Vital Signs: BP 119/70   Pulse 65   Ht 4' 11.2" (1.504 m)   Wt 130 lb (59 kg)   BMI 26.08 kg/m?  ? ?Physical Exam ?Constitutional:   ?   Appearance: He is well-developed.  ?HENT:  ?   Head: Normocephalic and atraumatic.  ?   Right Ear: External ear normal.  ?   Left Ear:  External ear normal.  ?Eyes:  ?   Pupils: Pupils are equal, round, and reactive to light.  ?Neck:  ?   Thyroid: No thyromegaly.  ?   Trachea: No tracheal deviation.  ?Cardiovascular:  ?   Rate and Rhythm: Normal rate.  ?Pulmonary:  ?   Effort: Pulmonary effort is normal.  ?   Breath sounds: No wheezing.  ?Abdominal:  ?   General: Bowel sounds are normal.  ?   Palpations: Abdomen is soft.  ?Musculoskeletal:  ?   Cervical back: Neck supple.  ?Skin: ?   General: Skin is warm and dry.  ?   Capillary Refill: Capillary refill takes less than 2 seconds.  ?Neurological:  ?   Mental Status: He is alert and oriented to person, place, and time.  ?Psychiatric:     ?   Behavior: Behavior normal.     ?   Thought Content: Thought content normal.     ?   Judgment: Judgment normal.  ? ? ?Ortho Exam patient slow getting from sitting standing does better hang onto the counter standing upright.  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  Is able to hold himself up ? ?Specialty Comments:  ?No specialty comments available. ? ?  Imaging: ?XR Lumbar Spine 2-3 Views ? ?Result Date: 12/10/2021 ?AP lateral lumbar spine images are obtained reviewed this shows compression fractures T11,T12 at least 50%.  Osteopenia noted.  There is considerable calcification of the abdominal aorta. Impression: T11 and T12 compression fractures.  ? ? ?PMFS History: ?Patient Active Problem List  ? Diagnosis Date Noted  ? Thoracic compression fracture, sequela 12/10/2021  ? ?Past Medical History:  ?Diagnosis Date  ? ACS (acute coronary syndrome) ([facility])   ? CAD (coronary artery disease)   ?  ?No family history on file.  ?Past Surgical History:  ?Procedure Laterality Date  ? CORONARY ANGIOPLASTY WITH STENT PLACEMENT  2017  ? ?Social History  ? ?Occupational History  ? Not on file  ?Tobacco Use  ? Smoking status: Former  ?  Types: Cigarettes  ? Smokeless tobacco: Former  ?Substance and Sexual Activity  ? Alcohol use: Never  ? Drug use: Never  ? Sexual activity: Not  on file  ? ? ? ? ? ? ?

## 2021-12-11 ENCOUNTER — Inpatient Hospital Stay: Admission: RE | Admit: 2021-12-11 | Payer: Medicaid Other | Source: Ambulatory Visit

## 2021-12-13 LAB — QUANTIFERON-TB GOLD PLUS
QuantiFERON Mitogen Value: >10 IU/mL
QuantiFERON Nil Value: >10 IU/mL
QuantiFERON TB1 Ag Value: >10 IU/mL
QuantiFERON TB2 Ag Value: >10 IU/mL
QuantiFERON-TB Gold Plus: UNDETERMINED — AB

## 2021-12-25 ENCOUNTER — Telehealth: Payer: Self-pay | Admitting: Orthopaedic Surgery

## 2021-12-25 NOTE — Telephone Encounter (Signed)
Pt case manager called nad states that pt did not have a DX number on his wheelchair script so the place did not give it to him. She would like to call with instructions to help the pt get this fixed.  ? ?[id number]  ?

## 2021-12-25 NOTE — Telephone Encounter (Signed)
I called and talked to the case manager. She states that the pt is unable to get the wheelchair because the place that they took it to wont except two prescriptions. Do you recall where you sent them so I can call them directly, prior to calling pt? ?

## 2021-12-26 NOTE — Telephone Encounter (Signed)
Due to language barrier I ordered the walker thru parachute  ?

## 2021-12-26 NOTE — Telephone Encounter (Signed)
Tried calling pt with language service but was unable to reach pt or lvm ?

## 2022-02-25 ENCOUNTER — Ambulatory Visit (INDEPENDENT_AMBULATORY_CARE_PROVIDER_SITE_OTHER): Payer: Medicaid Other

## 2022-02-25 ENCOUNTER — Ambulatory Visit (INDEPENDENT_AMBULATORY_CARE_PROVIDER_SITE_OTHER): Payer: Medicaid Other | Admitting: Orthopaedic Surgery

## 2022-02-25 VITALS — BP 130/73 | HR 58 | Ht 59.2 in | Wt 130.0 lb

## 2022-02-25 DIAGNOSIS — S22000D Wedge compression fracture of unspecified thoracic vertebra, subsequent encounter for fracture with routine healing: Secondary | ICD-10-CM | POA: Diagnosis not present

## 2022-02-25 DIAGNOSIS — S22000S Wedge compression fracture of unspecified thoracic vertebra, sequela: Secondary | ICD-10-CM | POA: Diagnosis not present

## 2022-02-25 DIAGNOSIS — M47816 Spondylosis without myelopathy or radiculopathy, lumbar region: Secondary | ICD-10-CM | POA: Diagnosis not present

## 2022-03-11 ENCOUNTER — Ambulatory Visit (HOSPITAL_BASED_OUTPATIENT_CLINIC_OR_DEPARTMENT_OTHER)
Admission: RE | Admit: 2022-03-11 | Discharge: 2022-03-11 | Disposition: A | Discharge status: Home / Self Care | Payer: Medicaid Other | Source: Ambulatory Visit | Attending: Orthopaedic Surgery | Admitting: Orthopaedic Surgery

## 2022-03-11 ENCOUNTER — Other Ambulatory Visit: Payer: Self-pay

## 2022-03-11 ENCOUNTER — Emergency Department (HOSPITAL_BASED_OUTPATIENT_CLINIC_OR_DEPARTMENT_OTHER): Payer: Medicaid Other

## 2022-03-11 ENCOUNTER — Emergency Department (HOSPITAL_BASED_OUTPATIENT_CLINIC_OR_DEPARTMENT_OTHER)
Admission: EM | Admit: 2022-03-11 | Discharge: 2022-03-11 | Disposition: A | Discharge status: Home / Self Care | Payer: Medicaid Other | Attending: Emergency Medicine | Admitting: Emergency Medicine

## 2022-03-11 ENCOUNTER — Encounter (HOSPITAL_BASED_OUTPATIENT_CLINIC_OR_DEPARTMENT_OTHER): Payer: Self-pay | Admitting: Emergency Medicine

## 2022-03-11 DIAGNOSIS — R051 Acute cough: Secondary | ICD-10-CM | POA: Diagnosis not present

## 2022-03-11 DIAGNOSIS — X58XXXS Exposure to other specified factors, sequela: Secondary | ICD-10-CM | POA: Insufficient documentation

## 2022-03-11 DIAGNOSIS — Z20822 Contact with and (suspected) exposure to covid-19: Secondary | ICD-10-CM | POA: Diagnosis not present

## 2022-03-11 DIAGNOSIS — S22000S Wedge compression fracture of unspecified thoracic vertebra, sequela: Secondary | ICD-10-CM | POA: Diagnosis not present

## 2022-03-11 DIAGNOSIS — Y939 Activity, unspecified: Secondary | ICD-10-CM | POA: Insufficient documentation

## 2022-03-11 DIAGNOSIS — M4854XS Collapsed vertebra, not elsewhere classified, thoracic region, sequela of fracture: Secondary | ICD-10-CM | POA: Insufficient documentation

## 2022-03-11 DIAGNOSIS — X58XXXA Exposure to other specified factors, initial encounter: Secondary | ICD-10-CM | POA: Diagnosis not present

## 2022-03-11 DIAGNOSIS — R0981 Nasal congestion: Secondary | ICD-10-CM | POA: Diagnosis not present

## 2022-03-11 DIAGNOSIS — M4854XA Collapsed vertebra, not elsewhere classified, thoracic region, initial encounter for fracture: Secondary | ICD-10-CM | POA: Insufficient documentation

## 2022-03-11 DIAGNOSIS — R059 Cough, unspecified: Secondary | ICD-10-CM | POA: Diagnosis present

## 2022-03-11 DIAGNOSIS — R0602 Shortness of breath: Secondary | ICD-10-CM | POA: Insufficient documentation

## 2022-03-11 DIAGNOSIS — Z7902 Long term (current) use of antithrombotics/antiplatelets: Secondary | ICD-10-CM | POA: Insufficient documentation

## 2022-03-11 LAB — COMPREHENSIVE METABOLIC PANEL
ALT: 23 U/L (ref 0–44)
AST: 32 U/L (ref 15–41)
Albumin: 3.4 g/dL — ABNORMAL LOW (ref 3.5–5.0)
Alkaline Phosphatase: 75 U/L (ref 38–126)
Anion gap: 5 (ref 5–15)
BUN: 20 mg/dL (ref 8–23)
CO2: 29 mmol/L (ref 22–32)
Calcium: 9.1 mg/dL (ref 8.9–10.3)
Chloride: 104 mmol/L (ref 98–111)
Creatinine, Ser: 0.91 mg/dL (ref 0.61–1.24)
GFR, Estimated: >60 mL/min (ref >60)
Glucose, Bld: 124 mg/dL — ABNORMAL HIGH (ref 70–99)
Potassium: 4 mmol/L (ref 3.5–5.1)
Sodium: 138 mmol/L (ref 135–145)
Total Bilirubin: 0.7 mg/dL (ref 0.3–1.2)
Total Protein: 6.7 g/dL (ref 6.5–8.1)

## 2022-03-11 LAB — CBC
HCT: 34.8 % — ABNORMAL LOW (ref 39.0–52.0)
Hemoglobin: 11.9 g/dL — ABNORMAL LOW (ref 13.0–17.0)
MCH: 31.8 pg (ref 26.0–34.0)
MCHC: 34.2 g/dL (ref 30.0–36.0)
MCV: 93 fL (ref 80.0–100.0)
Platelets: 182 10*3/uL (ref 150–400)
RBC: 3.74 MIL/uL — ABNORMAL LOW (ref 4.22–5.81)
RDW: 13.2 % (ref 11.5–15.5)
WBC: 6.8 10*3/uL (ref 4.0–10.5)
nRBC: 0 % (ref 0.0–0.2)

## 2022-03-11 LAB — TROPONIN I (HIGH SENSITIVITY)
Troponin I (High Sensitivity): 6 ng/L (ref <18)
Troponin I (High Sensitivity): 5 ng/L (ref <18)

## 2022-03-11 LAB — SARS CORONAVIRUS 2 BY RT PCR: SARS Coronavirus 2 by RT PCR: NEGATIVE

## 2022-03-11 MED ORDER — PREDNISONE 20 MG PO TABS: 20.0000 mg | ORAL_TABLET | Freq: Two times a day (BID) | ORAL | 0 refills | Status: AC

## 2022-03-11 MED ORDER — DOXYCYCLINE HYCLATE 100 MG PO CAPS: 100.0000 mg | ORAL_CAPSULE | Freq: Two times a day (BID) | ORAL | 0 refills | Status: AC

## 2022-03-11 NOTE — ED Notes (Signed)
Patient given discharge instructions, all questions answered. Patient in possession of all belongings, directed to the discharge area  

## 2022-03-11 NOTE — Discharge Instructions (Addendum)
Call your primary care doctor or specialist as discussed in the next 2-3 days.   Return immediately back to the ER if:  Your symptoms worsen within the next 12-24 hours. You develop new symptoms such as new fevers, persistent vomiting, new pain, shortness of breath, or new weakness or numbness, or if you have any other concerns.  

## 2022-03-11 NOTE — Congregational Nurse Program (Signed)
  Dept: [phone]   Congregational Nurse Program Note  Date of Encounter: 03/11/2022  Past Medical History: Past Medical History:  Diagnosis Date   ACS (acute coronary syndrome) (HCC)    CAD (coronary artery disease)     Encounter Details:  CNP Questionnaire - 03/11/22 1257       Questionnaire   Do you give verbal consent to treat you today? Yes    Location Patient Served  [facility]    Visit Setting Church or Organization    Patient Status Refugee    Insurance [facility]    Insurance Referral N/A    Medication N/A    Medical Provider Yes    Screening Referrals N/A    Medical Referral Urgent Care    Medical Appointment Made [facility] PCP/clinic    Food N/A    Transportation Need transportation assistance;Provided transportation assistance    Housing/Utilities N/A    Interpersonal Safety N/A    Intervention Blood pressure;Advocate;Navigate Healthcare System;Case Management;Counsel;Educate;Support    ED Visit Averted N/A    Life-Saving Intervention Made N/A            Patient came in complaining of shortness of breath on exertion and cough. Requesting to see provider. Appointment made for August. Has appointment today with radiology at [facility] and advised to go to urgent care in same building.   Nicole Cella Frances Ambrosino RN BSN PCCN  [facility] [phone]

## 2022-03-11 NOTE — ED Provider Notes (Signed)
[facility] EMERGENCY DEPARTMENT Provider Note   CSN: [id number] Arrival date & time: 03/11/22  1439     History  Chief Complaint  Patient presents with   Shortness of Breath   Cough    Christopher Chandler is a 81 y.o. male.  Patient presents ER chief complaint of cough congestion ongoing for the past 4 to 5 days.  Denies any fevers denies any vomiting or diarrhea.  Denies any headache or chest pain or abdominal pain.       Home Medications Prior to Admission medications   Medication Sig Start Date End Date Taking? Authorizing Provider  doxycycline (VIBRAMYCIN) 100 MG capsule Take 1 capsule (100 mg total) by mouth 2 (two) times daily for 7 days. 03/11/22 03/18/22 Yes Tyliek Timberman, Eustace Moore, MD  predniSONE (DELTASONE) 20 MG tablet Take 1 tablet (20 mg total) by mouth 2 (two) times daily with a meal for 5 days. 03/11/22 03/16/22 Yes Cheryll Cockayne, MD  b complex vitamins capsule Take 1 capsule by mouth daily.    [provider]  b complex vitamins capsule Take 1 capsule by mouth daily. 11/13/21 11/13/22  Barbette Merino, NP  clopidogrel (PLAVIX) 75 MG tablet Take 1 tablet by mouth in the evening. 10/07/21 04/05/22  Barbette Merino, NP  diltiazem (TIAZAC) 120 MG 24 hr capsule Take 1 capsule by mouth in the morning. 10/07/21 04/05/22  Barbette Merino, NP  Misc. Devices (ROLLATOR ULTRA-LIGHT) MISC 1 application by Does not apply route daily. 08/15/21   Barbette Merino, NP  Misc. Devices (ROLLATOR ULTRA-LIGHT) MISC 1 Device by Does not apply route daily. 11/13/21   Barbette Merino, NP  rosuvastatin (CRESTOR) 20 MG tablet Take 1 tablet by mouth daily in the evening. 10/07/21 04/05/22  Barbette Merino, NP      Allergies    Metronidazole    Review of Systems   Review of Systems  Constitutional:  Negative for fever.  HENT:  Negative for ear pain and sore throat.   Eyes:  Negative for pain.  Respiratory:  Positive for cough and chest tightness.   Cardiovascular:  Negative for chest pain.   Gastrointestinal:  Negative for abdominal pain.  Genitourinary:  Negative for flank pain.  Musculoskeletal:  Negative for back pain.  Skin:  Negative for color change and rash.  Neurological:  Negative for syncope.  All other systems reviewed and are negative.   Physical Exam Updated Vital Signs BP 120/70   Pulse (!) 58   Temp 98.8 F (37.1 C) (Oral)   Resp 18   Wt 60 kg   SpO2 99%   BMI 26.54 kg/m  Physical Exam Constitutional:      Appearance: He is well-developed.  HENT:     Head: Normocephalic.     Nose: Nose normal.  Eyes:     Extraocular Movements: Extraocular movements intact.  Cardiovascular:     Rate and Rhythm: Normal rate.  Pulmonary:     Effort: Pulmonary effort is normal.     Breath sounds: No decreased breath sounds, wheezing, rhonchi or rales.  Abdominal:     General: There is no distension.     Tenderness: There is no abdominal tenderness. There is no guarding.  Skin:    Coloration: Skin is not jaundiced.  Neurological:     Mental Status: He is alert. Mental status is at baseline.     ED Results / Procedures / Treatments   Labs (all labs ordered are listed,  but only abnormal results are displayed) Labs Reviewed  CBC - Abnormal; Notable for the following components:      Result Value   RBC 3.74 (*)    Hemoglobin 11.9 (*)    HCT 34.8 (*)    All other components within normal limits  COMPREHENSIVE METABOLIC PANEL - Abnormal; Notable for the following components:   Glucose, Bld 124 (*)    Albumin 3.4 (*)    All other components within normal limits  SARS CORONAVIRUS 2 BY RT PCR  TROPONIN I (HIGH SENSITIVITY)  TROPONIN I (HIGH SENSITIVITY)    EKG EKG Interpretation  Date/Time:  Tuesday March 11 2022 14:52:03 EDT Ventricular Rate:  63 PR Interval:  180 QRS Duration: 90 QT Interval:  422 QTC Calculation: 431 R Axis:   81 Text Interpretation: Normal sinus rhythm Normal ECG No previous ECGs available Confirmed by Norman Clay ([id number]) on  03/11/2022 6:16:33 PM  Radiology DG Bone Density  Result Date: 03/11/2022 EXAM: DUAL X-RAY ABSORPTIOMETRY (DXA) FOR BONE MINERAL DENSITY IMPRESSION: MARK C YATES Your patient Christopher Chandler completed a BMD test on 03/11/2022 using the Lunar IDXA DXA System (analysis version: 16.SP2) manufactured by [employer]. The following summarizes the results of our evaluation. PATIENT: Name: Christopher Chandler Patient ID: [id number] Birth Date: 05-18-1941 Height: 60.0 in. Gender: Male Measured: 03/11/2022 Weight: 130.4 lbs. 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 L- 3 & 4 was excluded due to degenerative changes. The scan quality is good. Site Region Measured Date Measured Age WHO YA BMD Classification T-score AP Spine L1-L2 03/11/2022 [age] Osteoporosis -3.2 0.777 g/cm2 DualFemur Total Right 2/11/2022 [age] Osteoporosis -4.0 0.509 g/cm2 Right Forearm Radius 33% 2/11/2022 [age] Osteoporosis -5.2 0.470 g/cm2 World Health Organization [facility]) criteria for post-menopausal, Caucasian Women: Normal        T-score at or above -1 SD Low Bone Mass T-score between -1 and -2.5 SD Osteoporosis  T-score at or below -2.5 SD RECOMMENDATION: 1. All patients should optimize calcium and vitamin D intake. 2. Consider FDA-approved medical therapies in postmenopausal women and men aged 68 years and older, based on the following: a. A hip or vertebral(clinical or morphometric) fracture. b. T-Score < -2.5 at the femoral neck or spine after appropriate evaluation to exclude secondary causes c. Low bone mass (T-score between -1.0 and -2.5 at the femoral neck or spine) and a 10 year probability of a hip fracture >3% or a 10 year probability of major osteoporosis-related fracture > 20%  based on the [place]-adapted WHO algorithm d. Clinical judgement and/or patient preferences may indicate treatment for people with 10-year fracture probabilities above or below these levels FOLLOW-UP: People with diagnosed cases of osteoporosis or osteopenia should be regularly tested for bone mineral density. For patients eligible for Medicare, routine testing is allowed once every 2 years. The testing frequency can be increased to one year for patients who have rapidly progressing disease, or for those who are receiving medical therapy to restore bone mass. I have reviewed this report and agree with the above findings. [facility] Radiology Electronically Signed   By: Romona Curls M.D.   On: 03/11/2022 15:23   DG Chest 2 View  Result Date: 03/11/2022 CLINICAL DATA:  Shortness of breath.  No cough. EXAM: CHEST - 2 VIEW COMPARISON:  Chest  radiograph 11/04/2021 FINDINGS: Mid and upper lung zone pleuroparenchymal scarring and architectural distortion with hilar retraction, chronic. Air-filled cavity in the periphery of the right upper hemithorax, unchanged. Improvement in the diffuse reticulonodular opacities from prior exam. Normal heart size. Stable mediastinal contours with hilar retraction. Blunting of the costophrenic angles, likely related to scarring and/or hyperinflation, no significant effusion. No new airspace disease. Lower thoracic compression fracture. Similar metallic BB projecting over the right lower hemithorax. IMPRESSION: 1. Chronic biapical pleuroparenchymal opacity, architectural distortion on and hilar retraction with cavitary component on the right. Findings may represent chronic systemic disease such as sarcoidosis versus atypical infection such as tuberculosis. Is unchanged from prior radiographs. 2. Mild improvement in the diffuse reticulonodular opacities from prior exam. Electronically Signed   By: Narda Rutherford M.D.   On: 03/11/2022 15:22    Procedures Procedures    Medications  Ordered in ED Medications - No data to display  ED Course/ Medical Decision Making/ A&P                           Medical Decision Making Amount and/or Complexity of Data Reviewed Labs: ordered. Radiology: ordered.  Risk Prescription drug management.   Review of chart shows office visit February 25, 2022 for thoracic compression fracture.  History from wife at bedside.  Cardiac monitor shows sinus rhythm.  Work-up today included labs.  White count 6 hemoglobin 12 chemistry unremarkable.  Troponin is normal at 6.  COVID test is negative.  Chest x-ray shows chronic appearing lung disease no acute infiltrate or effusion or edema noted.  Patient's vital signs remained stable he remains pain and symptom-free.  Given a prescription of steroids and antibiotics.  Advised outpatient follow-up with his doctor this week.  Advised immediate return for worsening symptoms or any additional concerns.        Final Clinical Impression(s) / ED Diagnoses Final diagnoses:  Acute cough    Rx / DC Orders ED Discharge Orders          Ordered    predniSONE (DELTASONE) 20 MG tablet  2 times daily with meals        03/11/22 1828    doxycycline (VIBRAMYCIN) 100 MG capsule  2 times daily        03/11/22 1828              Cheryll Cockayne, MD 03/11/22 1831

## 2022-03-11 NOTE — ED Triage Notes (Signed)
Triage completed with [place] interpreter.   Pt c/o cough, chest tightness and shob x 5 days. Experienced same sx 1 year ago.

## 2022-03-12 ENCOUNTER — Other Ambulatory Visit: Payer: Self-pay

## 2022-03-12 DIAGNOSIS — S22000S Wedge compression fracture of unspecified thoracic vertebra, sequela: Secondary | ICD-10-CM

## 2022-03-22 NOTE — Progress Notes (Signed)
Nontraumatic thoracic compression fracture.

## 2022-04-04 ENCOUNTER — Ambulatory Visit: Payer: Medicaid Other | Admitting: Nurse Practitioner

## 2022-04-09 ENCOUNTER — Ambulatory Visit
Admission: RE | Admit: 2022-04-09 | Discharge: 2022-04-09 | Disposition: A | Discharge status: Home / Self Care | Payer: Medicaid Other | Source: Ambulatory Visit | Attending: Family Medicine | Admitting: Family Medicine

## 2022-04-09 ENCOUNTER — Other Ambulatory Visit: Payer: Self-pay | Admitting: Family Medicine

## 2022-04-09 DIAGNOSIS — R059 Cough, unspecified: Secondary | ICD-10-CM

## 2022-05-12 ENCOUNTER — Encounter: Payer: Self-pay | Admitting: Pulmonary Disease

## 2022-05-12 ENCOUNTER — Ambulatory Visit (INDEPENDENT_AMBULATORY_CARE_PROVIDER_SITE_OTHER): Payer: Medicaid Other | Admitting: Pulmonary Disease

## 2022-05-12 VITALS — BP 90/50 | HR 63 | Ht 59.2 in | Wt 128.0 lb

## 2022-05-12 DIAGNOSIS — R911 Solitary pulmonary nodule: Secondary | ICD-10-CM

## 2022-05-12 DIAGNOSIS — Z8611 Personal history of tuberculosis: Secondary | ICD-10-CM | POA: Diagnosis not present

## 2022-05-12 DIAGNOSIS — J984 Other disorders of lung: Secondary | ICD-10-CM

## 2022-05-12 DIAGNOSIS — R9389 Abnormal findings on diagnostic imaging of other specified body structures: Secondary | ICD-10-CM | POA: Diagnosis not present

## 2022-05-12 NOTE — Patient Instructions (Signed)
Thank you for visiting Dr. Tonia Brooms at [facility]. Today we recommend the following:  Orders Placed This Encounter  Procedures   CT Chest Wo Contrast   Return in about 4 weeks (around 06/09/2022) for with APP or Dr. Tonia Brooms.    Please do your part to reduce the spread of COVID-19.

## 2022-05-12 NOTE — Progress Notes (Signed)
Synopsis: Referred in September 2023 for shortness of breath lung nodules by Barbette Merino, NP  Subjective:   PATIENT ID: Christopher Chandler GENDER: male DOB: 11/24/1940, MRN: [id number]  Chief Complaint  Patient presents with   Other    History of TB    This is an 81 year old gentleman, immigrated from [name].  Patient has past medical history of acute coronary syndrome, coronary stent placement.  55-year history of smoking quit smoking 6 years ago.  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  He was clear at the time of his immigration to the [place].  He is able to complete his activities of daily living.  He is short of breath with exertion he does however use a walker.    Past Medical History:  Diagnosis Date   ACS (acute coronary syndrome) (HCC)    CAD (coronary artery disease)      No family history on file.   Past Surgical History:  Procedure Laterality Date   CORONARY ANGIOPLASTY WITH STENT PLACEMENT  2017    Social History   Socioeconomic History   Marital status: Married    Spouse name: Not on file   Number of children: Not on file   Years of education: Not on file   Highest education level: Not on file  Occupational History   Not on file  Tobacco Use   Smoking status: Former    Types: Cigarettes   Smokeless tobacco: Former  Substance and Sexual Activity   Alcohol use: Never   Drug use: Never   Sexual activity: Not on file  Other Topics Concern   Not on file  Social History Narrative   Not on file   Social Determinants of Health   Financial Resource Strain: Not on file  Food Insecurity: Not on file  Transportation Needs: Not on file  Physical Activity: Not on file  Stress: Not on file  Social Connections: Not on file  Intimate Partner Violence: Not on file     Allergies  Allergen Reactions   Metronidazole      Outpatient Medications Prior to Visit  Medication Sig Dispense Refill   b complex  vitamins capsule Take 1 capsule by mouth daily.     b complex vitamins capsule Take 1 capsule by mouth daily. 90 capsule 3   diltiazem (TIAZAC) 120 MG 24 hr capsule Take 1 capsule by mouth in the morning. 90 capsule 1   Misc. Devices (ROLLATOR ULTRA-LIGHT) MISC 1 application by Does not apply route daily. 1 each 0   Misc. Devices (ROLLATOR ULTRA-LIGHT) MISC 1 Device by Does not apply route daily. 1 each 0   rosuvastatin (CRESTOR) 20 MG tablet Take 1 tablet by mouth daily in the evening. 90 tablet 1   No facility-administered medications prior to visit.    Review of Systems  Constitutional:  Negative for chills, fever, malaise/fatigue and weight loss.  HENT:  Negative for hearing loss, sore throat and tinnitus.   Eyes:  Negative for blurred vision and double vision.  Respiratory:  Positive for shortness of breath. Negative for cough, hemoptysis, sputum production, wheezing and stridor.   Cardiovascular:  Negative for chest pain, palpitations, orthopnea, leg swelling and PND.  Gastrointestinal:  Negative for abdominal pain, constipation, diarrhea, heartburn, nausea and vomiting.  Genitourinary:  Negative for dysuria, hematuria and urgency.  Musculoskeletal:  Negative for joint pain and myalgias.  Skin:  Negative for itching and  rash.  Neurological:  Negative for dizziness, tingling, weakness and headaches.  Endo/Heme/Allergies:  Negative for environmental allergies. Does not bruise/bleed easily.  Psychiatric/Behavioral:  Negative for depression. The patient is not nervous/anxious and does not have insomnia.   All other systems reviewed and are negative.    Objective:  Physical Exam Vitals reviewed.  Constitutional:      General: He is not in acute distress.    Appearance: He is well-developed.  HENT:     Head: Normocephalic and atraumatic.  Eyes:     General: No scleral icterus.    Conjunctiva/sclera: Conjunctivae normal.     Pupils: Pupils are equal, round, and reactive to light.   Neck:     Vascular: No JVD.     Trachea: No tracheal deviation.  Cardiovascular:     Rate and Rhythm: Normal rate and regular rhythm.     Heart sounds: Normal heart sounds. No murmur heard. Pulmonary:     Effort: Pulmonary effort is normal. No tachypnea, accessory muscle usage or respiratory distress.     Breath sounds: No stridor. No wheezing, rhonchi or rales.  Abdominal:     General: There is no distension.     Palpations: Abdomen is soft.     Tenderness: There is no abdominal tenderness.  Musculoskeletal:        General: No tenderness.     Cervical back: Neck supple.  Lymphadenopathy:     Cervical: No cervical adenopathy.  Skin:    General: Skin is warm and dry.     Capillary Refill: Capillary refill takes less than 2 seconds.     Findings: No rash.  Neurological:     Mental Status: He is alert and oriented to person, place, and time.  Psychiatric:        Behavior: Behavior normal.      There were no vitals filed for this visit.   on RA BMI Readings from Last 3 Encounters:  03/11/22 26.54 kg/m  02/25/22 26.08 kg/m  12/10/21 26.08 kg/m   Wt Readings from Last 3 Encounters:  03/11/22 132 lb 4.4 oz (60 kg)  02/25/22 130 lb (59 kg)  12/10/21 130 lb (59 kg)     CBC    Component Value Date/Time   WBC 6.8 03/11/2022 1458   RBC 3.74 (L) 03/11/2022 1458   HGB 11.9 (L) 03/11/2022 1458   HGB 13.0 08/15/2021 1516   HCT 34.8 (L) 03/11/2022 1458   HCT 39.9 08/15/2021 1516   PLT 182 03/11/2022 1458   PLT 184 08/15/2021 1516   MCV 93.0 03/11/2022 1458   MCV 96 08/15/2021 1516   MCH 31.8 03/11/2022 1458   MCHC 34.2 03/11/2022 1458   RDW 13.2 03/11/2022 1458   RDW 12.5 08/15/2021 1516   LYMPHSABS 2.3 08/15/2021 1516   EOSABS 0.2 08/15/2021 1516   BASOSABS 0.0 08/15/2021 1516     Chest Imaging: Chest x-ray August 2023: Bilateral architectural distortion of the upper airways some apical right cystic lesion. The patient's images have been independently  reviewed by me.    Pulmonary Functions Testing Results:     No data to display          FeNO:   Pathology:   Echocardiogram:   Heart Catheterization:     Assessment & Plan:     ICD-10-CM   1. 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 Abnormal chest x-ray  R93.89  4. 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  Immigrated to the [place] a few years ago.  Plan: Overall I am not completely sure why he came today.  From a respiratory standpoint he is okay. There was obvious some breakdown in the communication for referral needs but he does have an abnormal chest x-ray with a right apical cystic lesion that needs CT imaging. I think next best step is to have a noncontrasted CT scan of the chest for evaluation of his lung parenchyma. This may be just architectural distortion related to his history of TB. He has however high risk for the development of malignancy due to his longstanding history 55 years of smoking. We will have a repeat CT scan of the chest for evaluation and he can follow-up with [place] after that. Can see me or APP after CT chest complete. He uses nebulizers currently to help with breathing and I think he continue to do that.  He may very well have COPD at baseline we do not have any PFTs but I am not sure that it would change his management much.  If he still having trouble breathing we could always consider adding of a long-acting inhaler.   Current Outpatient Medications:    b complex vitamins capsule, Take 1 capsule by mouth daily., Disp: , Rfl:    b complex vitamins capsule, Take 1 capsule by mouth daily., Disp: 90 capsule, Rfl: 3   diltiazem (TIAZAC) 120 MG 24 hr capsule, Take 1 capsule  by mouth in the morning., Disp: 90 capsule, Rfl: 1   Misc. Devices (ROLLATOR ULTRA-LIGHT) MISC, 1 application by Does not apply route daily., Disp: 1 each, Rfl: 0   Misc. Devices (ROLLATOR ULTRA-LIGHT) MISC, 1 Device by Does not apply route daily., Disp: 1 each, Rfl: 0   rosuvastatin (CRESTOR) 20 MG tablet, Take 1 tablet by mouth daily in the evening., Disp: 90 tablet, Rfl: 1  I spent 46 minutes dedicated to the care of this patient on the date of this encounter to include pre-visit review of records, face-to-face time with the patient discussing conditions above, post visit ordering of testing, clinical documentation with the electronic health record, making appropriate referrals as documented, and communicating necessary findings to members of the patients care team.   Josephine Igo, DO [facility] 05/12/2022 4:10 PM

## 2022-05-16 ENCOUNTER — Other Ambulatory Visit: Payer: Self-pay | Admitting: Vascular Surgery

## 2022-05-16 ENCOUNTER — Telehealth: Payer: Self-pay | Admitting: Pulmonary Disease

## 2022-05-16 DIAGNOSIS — R918 Other nonspecific abnormal finding of lung field: Secondary | ICD-10-CM

## 2022-05-16 DIAGNOSIS — Z87891 Personal history of nicotine dependence: Secondary | ICD-10-CM

## 2022-05-16 DIAGNOSIS — J45909 Unspecified asthma, uncomplicated: Secondary | ICD-10-CM

## 2022-05-19 NOTE — Telephone Encounter (Signed)
Addressed by Dr Valeta Harms. Nothing further needed

## 2022-05-19 NOTE — Telephone Encounter (Signed)
Clemens Catholic, NP from [place] wants to speak with nurse or doctor regarding patients care. Referred to [place] for bronch, concern for active TB, has already spoken with [facility]. Direct call back number [phone]

## 2022-05-21 ENCOUNTER — Ambulatory Visit (HOSPITAL_BASED_OUTPATIENT_CLINIC_OR_DEPARTMENT_OTHER): Payer: Medicaid Other

## 2022-06-20 ENCOUNTER — Ambulatory Visit
Admission: RE | Admit: 2022-06-20 | Discharge: 2022-06-20 | Disposition: A | Discharge status: Home / Self Care | Payer: Medicaid Other | Source: Ambulatory Visit | Attending: Vascular Surgery | Admitting: Vascular Surgery

## 2022-06-20 DIAGNOSIS — Z87891 Personal history of nicotine dependence: Secondary | ICD-10-CM

## 2022-06-20 DIAGNOSIS — R918 Other nonspecific abnormal finding of lung field: Secondary | ICD-10-CM

## 2022-06-20 DIAGNOSIS — J45909 Unspecified asthma, uncomplicated: Secondary | ICD-10-CM

## 2022-07-03 ENCOUNTER — Telehealth: Payer: Self-pay | Admitting: Pulmonary Disease

## 2022-07-08 NOTE — Telephone Encounter (Signed)
LMOM for Pt to return call for Ct scan results.

## 2023-06-25 IMAGING — DX DG CHEST 2V
2 series · 2 of 2 positions shown · non-contrast
Comparison: Chest x-ray 09/24/2021.

CLINICAL DATA: 80-year-old male with history of abnormal chest
x-ray.

EXAM:
CHEST - 2 VIEW

[dg chest 2 view (1 of 2)]
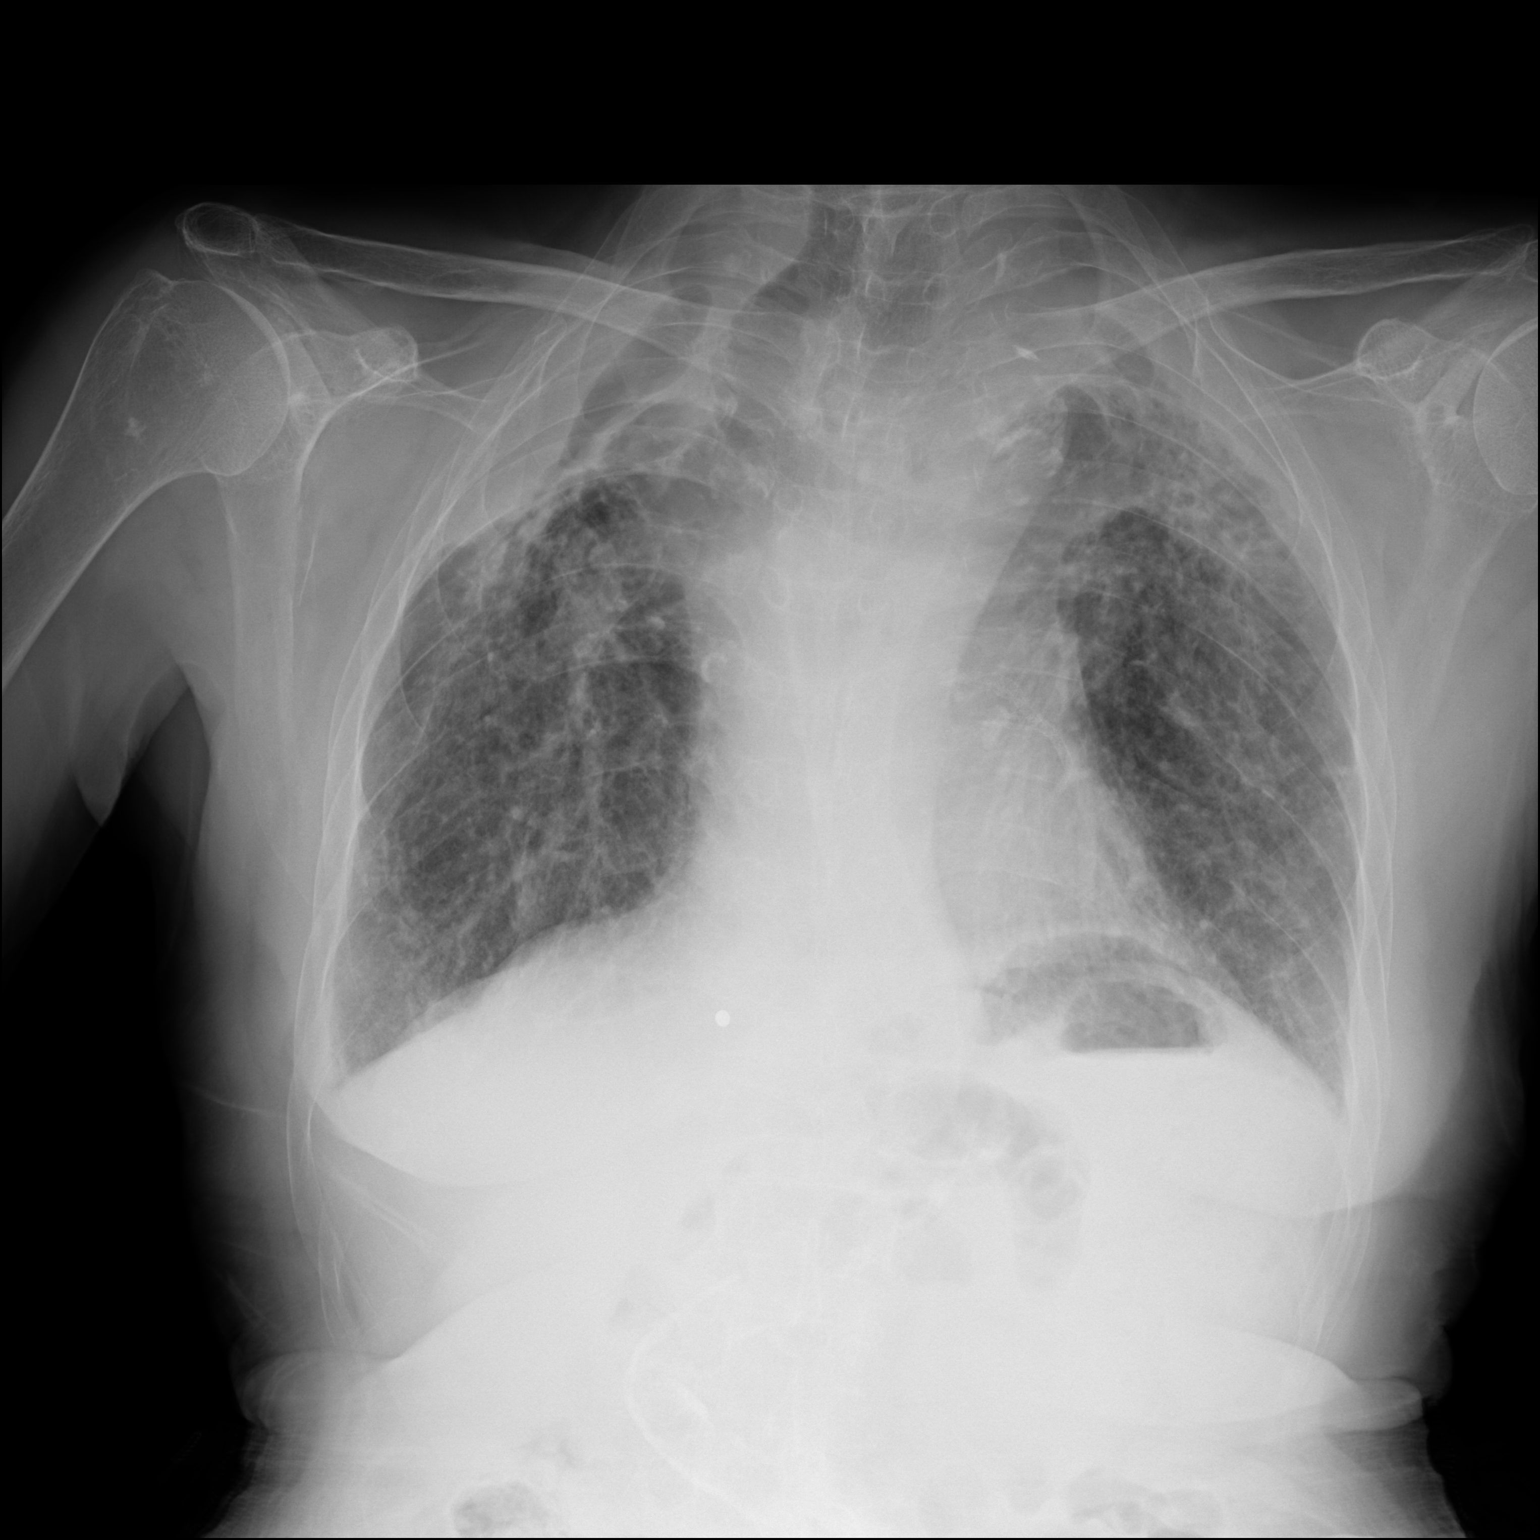

[dg chest 2 view (2 of 2)]
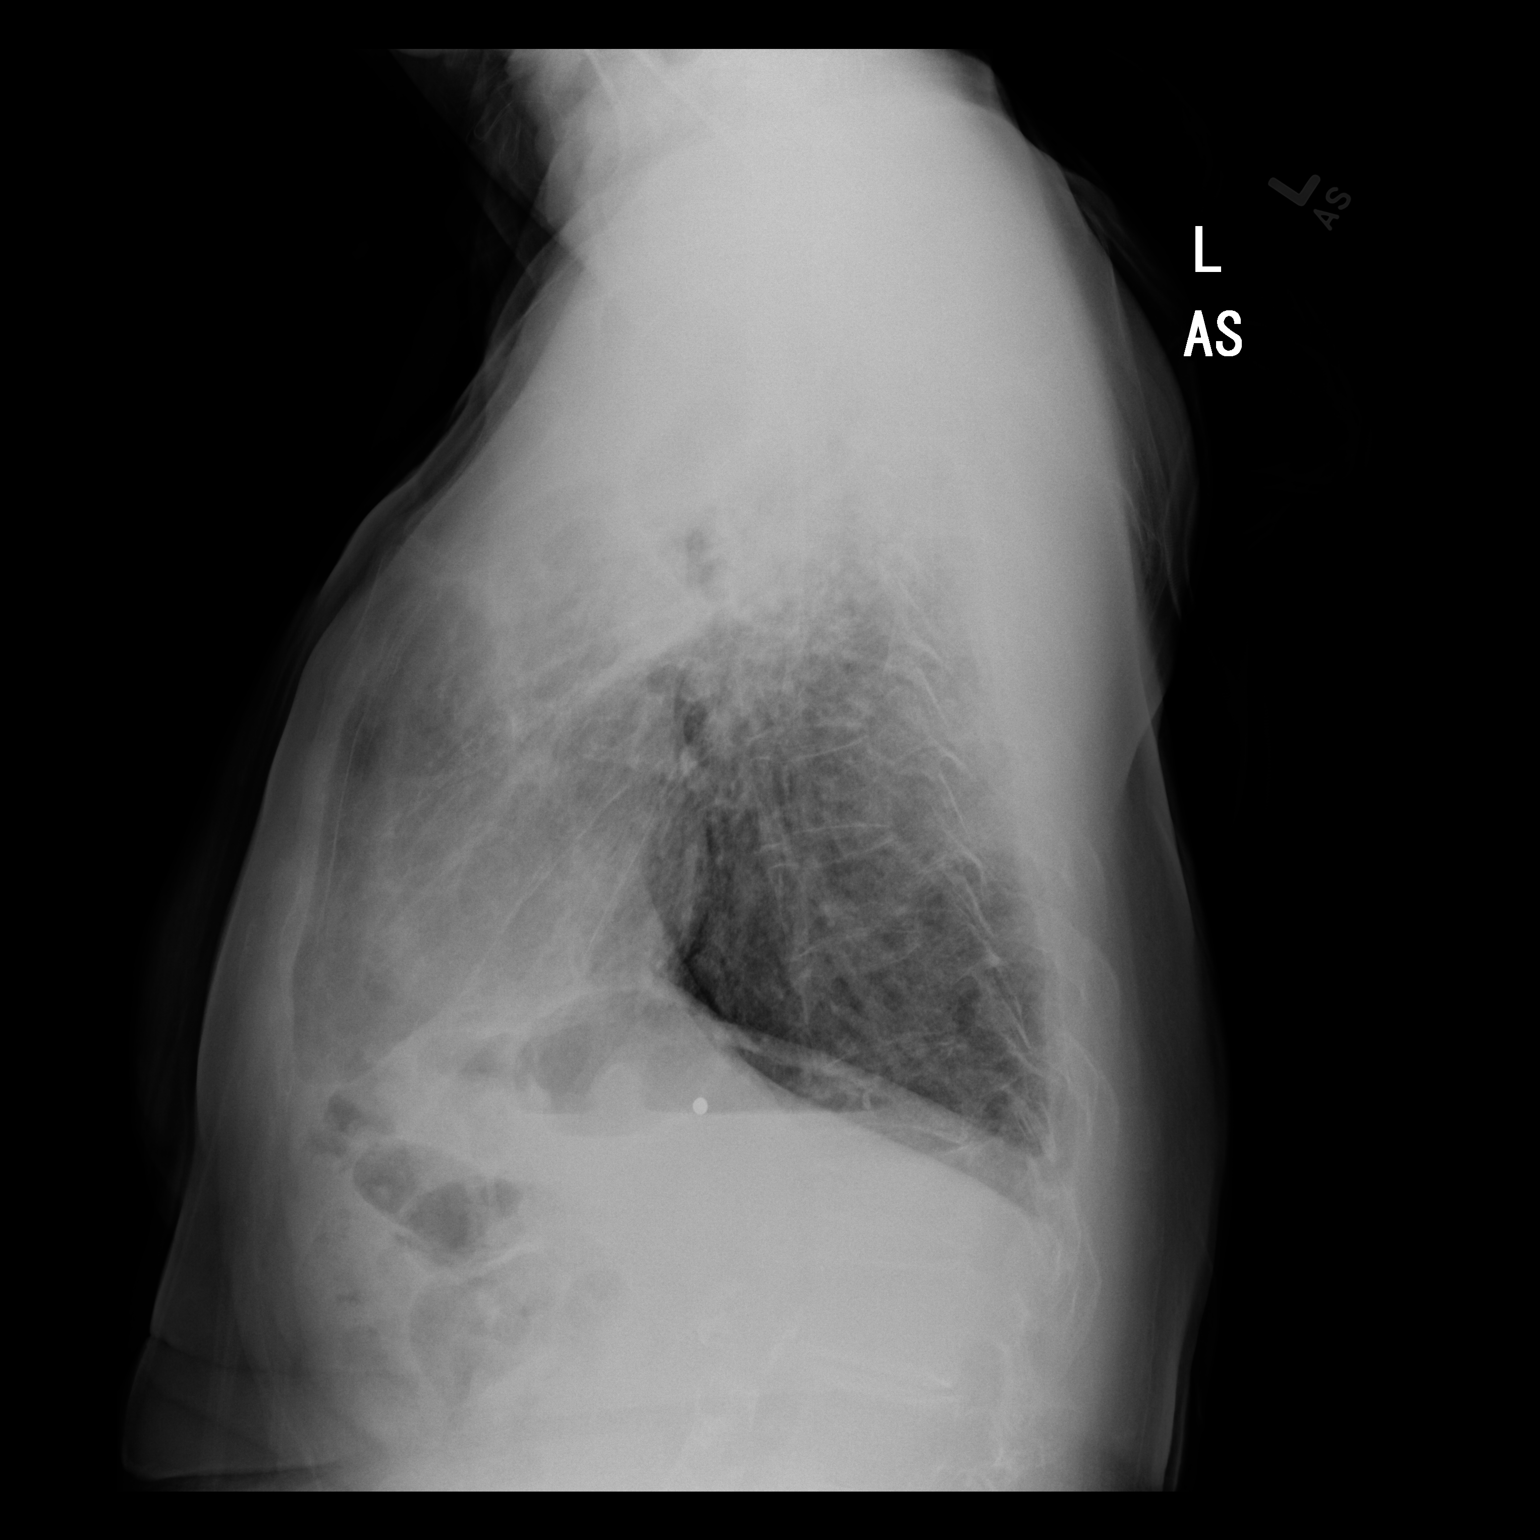

[2 of 2 positions shown; findings below may reference images not displayed]

FINDINGS: Extensive reticulonodular opacities are noted throughout both lungs,
most severe throughout the mid to upper lungs where there is
extensive nodular and mass-like pleuroparenchymal thickening and
architectural distortion, similar to the prior examination. Some
pleural calcifications are also noted in the left apex. No new acute
consolidative airspace disease. No definite pleural effusions. No
evidence of pulmonary edema. Heart size is normal. Upper mediastinal
contours are distorted, and hilar structures are retracted upward.
Atherosclerotic calcifications in the thoracic aorta. Metallic
density again noted projecting over the mediastinum, likely a
retained BB.
IMPRESSION: 1. Stable radiographic appearance of the chest, as above. Findings
likely reflect a systemic disease such as sarcoidosis.
Alternatively, sequela of atypical infection such as tuberculosis
could have a similar appearance. These findings could be better
evaluated with follow-up high-resolution chest CT (repeat
radiographs will add little to no value).
2. Aortic atherosclerosis.
